# Patient Record
Sex: Male | Born: 1940 | Race: White | Hispanic: No | Marital: Married | Smoking: Former smoker
Health system: Southern US, Community
[De-identification: ages and names within clinical notes are randomized; demographics above are authoritative.]

## PROBLEM LIST (undated history)

## (undated) DIAGNOSIS — I739 Peripheral vascular disease, unspecified: Secondary | ICD-10-CM

## (undated) DIAGNOSIS — I10 Essential (primary) hypertension: Secondary | ICD-10-CM

## (undated) DIAGNOSIS — K2270 Barrett's esophagus without dysplasia: Secondary | ICD-10-CM

## (undated) DIAGNOSIS — K219 Gastro-esophageal reflux disease without esophagitis: Secondary | ICD-10-CM

## (undated) DIAGNOSIS — J449 Chronic obstructive pulmonary disease, unspecified: Secondary | ICD-10-CM

## (undated) DIAGNOSIS — I712 Thoracic aortic aneurysm, without rupture, unspecified: Secondary | ICD-10-CM

## (undated) DIAGNOSIS — I719 Aortic aneurysm of unspecified site, without rupture: Secondary | ICD-10-CM

## (undated) DIAGNOSIS — N433 Hydrocele, unspecified: Secondary | ICD-10-CM

## (undated) DIAGNOSIS — I259 Chronic ischemic heart disease, unspecified: Secondary | ICD-10-CM

## (undated) DIAGNOSIS — E785 Hyperlipidemia, unspecified: Secondary | ICD-10-CM

## (undated) DIAGNOSIS — C801 Malignant (primary) neoplasm, unspecified: Secondary | ICD-10-CM

## (undated) HISTORY — DX: Peripheral vascular disease, unspecified: I73.9

## (undated) HISTORY — DX: Thoracic aortic aneurysm, without rupture: I71.2

## (undated) HISTORY — DX: Aortic aneurysm of unspecified site, without rupture: I71.9

## (undated) HISTORY — DX: Hydrocele, unspecified: N43.3

## (undated) HISTORY — DX: Chronic obstructive pulmonary disease, unspecified: J44.9

## (undated) HISTORY — PX: CORONARY ARTERY BYPASS GRAFT: SHX141

## (undated) HISTORY — DX: Thoracic aortic aneurysm, without rupture, unspecified: I71.20

## (undated) HISTORY — DX: Chronic ischemic heart disease, unspecified: I25.9

## (undated) HISTORY — DX: Barrett's esophagus without dysplasia: K22.70

## (undated) HISTORY — DX: Malignant (primary) neoplasm, unspecified: C80.1

## (undated) HISTORY — DX: Gastro-esophageal reflux disease without esophagitis: K21.9

## (undated) HISTORY — DX: Hyperlipidemia, unspecified: E78.5

## (undated) HISTORY — DX: Essential (primary) hypertension: I10

---

## 1997-10-14 HISTORY — PX: ABDOMINAL AORTIC ANEURYSM REPAIR W/ ENDOLUMINAL GRAFT: SUR7

## 2001-10-14 HISTORY — PX: CORONARY STENT PLACEMENT: SHX1402

## 2008-08-12 ENCOUNTER — Ambulatory Visit (HOSPITAL_BASED_OUTPATIENT_CLINIC_OR_DEPARTMENT_OTHER): Admission: RE | Admit: 2008-08-12 | Discharge: 2008-08-12 | Payer: Self-pay | Admitting: Orthopedic Surgery

## 2008-12-12 DEATH — deceased

## 2009-10-14 DIAGNOSIS — C801 Malignant (primary) neoplasm, unspecified: Secondary | ICD-10-CM

## 2009-10-14 DIAGNOSIS — I712 Thoracic aortic aneurysm, without rupture, unspecified: Secondary | ICD-10-CM

## 2009-10-14 DIAGNOSIS — I739 Peripheral vascular disease, unspecified: Secondary | ICD-10-CM | POA: Insufficient documentation

## 2009-10-14 HISTORY — DX: Thoracic aortic aneurysm, without rupture: I71.2

## 2009-10-14 HISTORY — DX: Thoracic aortic aneurysm, without rupture, unspecified: I71.20

## 2009-10-14 HISTORY — DX: Malignant (primary) neoplasm, unspecified: C80.1

## 2010-01-15 ENCOUNTER — Ambulatory Visit (HOSPITAL_COMMUNITY): Admission: RE | Admit: 2010-01-15 | Discharge: 2010-01-15 | Payer: Self-pay | Admitting: Family Medicine

## 2010-01-26 ENCOUNTER — Ambulatory Visit: Payer: Self-pay | Admitting: Thoracic Surgery

## 2010-02-12 ENCOUNTER — Inpatient Hospital Stay (HOSPITAL_COMMUNITY): Admission: RE | Admit: 2010-02-12 | Discharge: 2010-02-16 | Payer: Self-pay | Admitting: Thoracic Surgery

## 2010-02-12 ENCOUNTER — Encounter: Payer: Self-pay | Admitting: Thoracic Surgery

## 2010-02-12 ENCOUNTER — Ambulatory Visit: Payer: Self-pay | Admitting: Thoracic Surgery

## 2010-02-27 ENCOUNTER — Ambulatory Visit: Payer: Self-pay | Admitting: Thoracic Surgery

## 2010-02-27 ENCOUNTER — Encounter: Admission: RE | Admit: 2010-02-27 | Discharge: 2010-02-27 | Payer: Self-pay | Admitting: Thoracic Surgery

## 2010-03-16 ENCOUNTER — Ambulatory Visit: Payer: Self-pay | Admitting: Thoracic Surgery

## 2010-03-27 ENCOUNTER — Encounter: Admission: RE | Admit: 2010-03-27 | Discharge: 2010-03-27 | Payer: Self-pay | Admitting: Thoracic Surgery

## 2010-03-27 ENCOUNTER — Ambulatory Visit: Payer: Self-pay | Admitting: Thoracic Surgery

## 2010-04-17 ENCOUNTER — Ambulatory Visit: Payer: Self-pay | Admitting: Thoracic Surgery

## 2010-05-22 ENCOUNTER — Ambulatory Visit: Payer: Self-pay | Admitting: Thoracic Surgery

## 2010-05-22 ENCOUNTER — Encounter: Admission: RE | Admit: 2010-05-22 | Discharge: 2010-05-22 | Payer: Self-pay | Admitting: Thoracic Surgery

## 2010-07-24 ENCOUNTER — Ambulatory Visit: Payer: Self-pay | Admitting: Thoracic Surgery

## 2010-07-24 ENCOUNTER — Encounter: Admission: RE | Admit: 2010-07-24 | Discharge: 2010-07-24 | Payer: Self-pay | Admitting: Thoracic Surgery

## 2010-10-14 HISTORY — PX: OTHER SURGICAL HISTORY: SHX169

## 2010-10-24 ENCOUNTER — Encounter
Admission: RE | Admit: 2010-10-24 | Discharge: 2010-10-24 | Payer: Self-pay | Source: Home / Self Care | Attending: Thoracic Surgery | Admitting: Thoracic Surgery

## 2010-10-24 ENCOUNTER — Ambulatory Visit
Admission: RE | Admit: 2010-10-24 | Discharge: 2010-10-24 | Payer: Self-pay | Source: Home / Self Care | Attending: Thoracic Surgery | Admitting: Thoracic Surgery

## 2011-01-01 LAB — BASIC METABOLIC PANEL
BUN: 14 mg/dL (ref 6–23)
BUN: 9 mg/dL (ref 6–23)
CO2: 26 mEq/L (ref 19–32)
Calcium: 8 mg/dL — ABNORMAL LOW (ref 8.4–10.5)
Calcium: 8.6 mg/dL (ref 8.4–10.5)
Creatinine, Ser: 0.75 mg/dL (ref 0.4–1.5)
GFR calc non Af Amer: >60 mL/min (ref >60)
Glucose, Bld: 154 mg/dL — ABNORMAL HIGH (ref 70–99)
Potassium: 3.7 mEq/L (ref 3.5–5.1)

## 2011-01-01 LAB — CBC
HCT: 34 % — ABNORMAL LOW (ref 39.0–52.0)
HCT: 34.3 % — ABNORMAL LOW (ref 39.0–52.0)
HCT: 38.8 % — ABNORMAL LOW (ref 39.0–52.0)
Hemoglobin: 11.7 g/dL — ABNORMAL LOW (ref 13.0–17.0)
Hemoglobin: 11.8 g/dL — ABNORMAL LOW (ref 13.0–17.0)
MCHC: 34 g/dL (ref 30.0–36.0)
MCHC: 34.7 g/dL (ref 30.0–36.0)
MCHC: 35.2 g/dL (ref 30.0–36.0)
MCV: 92.8 fL (ref 78.0–100.0)
MCV: 93.1 fL (ref 78.0–100.0)
MCV: 92.7 fL (ref 78.0–100.0)
Platelets: 155 10*3/uL (ref 150–400)
Platelets: 188 10*3/uL (ref 150–400)
RBC: 3.66 MIL/uL — ABNORMAL LOW (ref 4.22–5.81)
RBC: 3.69 MIL/uL — ABNORMAL LOW (ref 4.22–5.81)
RDW: 13.4 % (ref 11.5–15.5)
RDW: 13.9 % (ref 11.5–15.5)
RDW: 13.8 % (ref 11.5–15.5)
WBC: 9.5 10*3/uL (ref 4.0–10.5)
WBC: 5.5 10*3/uL (ref 4.0–10.5)

## 2011-01-01 LAB — COMPREHENSIVE METABOLIC PANEL
ALT: 25 U/L (ref 0–53)
AST: 30 U/L (ref 0–37)
Albumin: 4.4 g/dL (ref 3.5–5.2)
BUN: 14 mg/dL (ref 6–23)
CO2: 29 mEq/L (ref 19–32)
Chloride: 104 mEq/L (ref 96–112)
Chloride: 106 mEq/L (ref 96–112)
Creatinine, Ser: 0.96 mg/dL (ref 0.4–1.5)
Creatinine, Ser: 0.84 mg/dL (ref 0.4–1.5)
GFR calc Af Amer: >60 mL/min (ref >60)
GFR calc non Af Amer: >60 mL/min (ref >60)
Glucose, Bld: 125 mg/dL — ABNORMAL HIGH (ref 70–99)
Glucose, Bld: 91 mg/dL (ref 70–99)
Total Bilirubin: 1 mg/dL (ref 0.3–1.2)
Total Bilirubin: 0.9 mg/dL (ref 0.3–1.2)

## 2011-01-01 LAB — BLOOD GAS, ARTERIAL
Acid-base deficit: 0.7 mmol/L (ref 0.0–2.0)
Bicarbonate: 23.4 mEq/L (ref 20.0–24.0)
O2 Saturation: 96.8 %
Patient temperature: 98.6
TCO2: 24.5 mmol/L (ref 0–100)
pH, Arterial: 7.407 (ref 7.350–7.450)

## 2011-01-01 LAB — TYPE AND SCREEN

## 2011-01-01 LAB — URINALYSIS, ROUTINE W REFLEX MICROSCOPIC
Bilirubin Urine: NEGATIVE
Ketones, ur: NEGATIVE mg/dL
Nitrite: NEGATIVE
Protein, ur: NEGATIVE mg/dL
Specific Gravity, Urine: 1.016 (ref 1.005–1.030)
Urobilinogen, UA: 0.2 mg/dL (ref 0.0–1.0)

## 2011-01-01 LAB — PROTIME-INR
INR: 1.04 (ref 0.00–1.49)
Prothrombin Time: 13.5 seconds (ref 11.6–15.2)

## 2011-01-01 LAB — APTT: aPTT: 31 seconds (ref 24–37)

## 2011-01-01 LAB — POCT I-STAT 3, ART BLOOD GAS (G3+)
O2 Saturation: 95 %
pCO2 arterial: 39.5 mmHg (ref 35.0–45.0)
pH, Arterial: 7.41 (ref 7.350–7.450)

## 2011-02-19 ENCOUNTER — Ambulatory Visit: Payer: Self-pay | Admitting: Thoracic Surgery

## 2011-02-25 ENCOUNTER — Other Ambulatory Visit: Payer: Self-pay | Admitting: Thoracic Surgery

## 2011-02-25 DIAGNOSIS — M5124 Other intervertebral disc displacement, thoracic region: Secondary | ICD-10-CM

## 2011-02-26 ENCOUNTER — Ambulatory Visit
Admission: RE | Admit: 2011-02-26 | Discharge: 2011-02-26 | Disposition: A | Discharge status: Home / Self Care | Payer: Medicare Other | Source: Ambulatory Visit | Attending: Thoracic Surgery | Admitting: Thoracic Surgery

## 2011-02-26 ENCOUNTER — Ambulatory Visit (INDEPENDENT_AMBULATORY_CARE_PROVIDER_SITE_OTHER): Payer: Medicare Other | Admitting: Thoracic Surgery

## 2011-02-26 DIAGNOSIS — M5124 Other intervertebral disc displacement, thoracic region: Secondary | ICD-10-CM

## 2011-02-26 DIAGNOSIS — C3490 Malignant neoplasm of unspecified part of unspecified bronchus or lung: Secondary | ICD-10-CM

## 2011-02-26 NOTE — Letter (Signed)
March 16, 2010     Re:  Drew [name], FILES                   DOB:  12-May-1941     The patient came back for follow up today.  He has one chest tube site.  This has taken a while to heal, but overall it looks good.  I cauterized  some granulation tissue with silver nitrate.  His blood pressure is  125/76, pulse 64, respirations 18, sats were 97%.  Lungs are clear to  auscultation and percussion.  I will see him back again March 27, 2010,  with a chest x-ray.   Ines Bloomer, M.D.  Electronically Signed   DPB/MEDQ  D:  03/16/2010  T:  03/17/2010  Job:  [id number]

## 2011-02-26 NOTE — Assessment & Plan Note (Signed)
OFFICE VISIT   MARTON, MALIZIA A  DOB:  November 14, 1940                                        March 16, 2010  CHART #:  [id number]   The patient came back for follow up today.  He has one chest tube site.  This has taken a while to heal, but overall it looks good.  I cauterized  some granulation tissue with silver nitrate.  His blood pressure is  125/76, pulse 64, respirations 18, sats were 97%.  Lungs are clear to  auscultation and percussion.  I will see him back again March 27, 2010,  with a chest x-ray.   Ines Bloomer, M.D.  Electronically Signed   DPB/MEDQ  D:  03/16/2010  T:  03/27/2010  Job:  [id number]

## 2011-02-26 NOTE — Assessment & Plan Note (Signed)
OFFICE VISIT   Drew [name], Drew [name]  DOB:  [birth year]                                        July 24, 2010  CHART #:  [id number]   The patient came today.  He is doing well and having some mild chest  pain, but overall making good progress.  We plan to see him back again  in 3 months with [name] chest x-ray.  He will get [name] CT scan of the chest next  month.  His blood pressure was 117/74, pulse 64, respirations 18, sats  were 94%.  Incisions are well healed.   Ines Bloomer, M.D.  Electronically Signed   DPB/MEDQ  D:  07/24/2010  T:  07/25/2010  Job:  [id number]

## 2011-02-26 NOTE — Assessment & Plan Note (Signed)
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  He will continue Neurontin.  His  incision is well healed.  His blood pressure was 114/68, pulse 48,  respirations 18, and sats were 98%.  We will see him again in 4 weeks  with [name] chest x-ray.  If he has more pain, he will call back and we will  prescribe Lidoderm patch.  The other possibilities will be Cymbalta and  Lyrica.   Ines Bloomer, M.D.  Electronically Signed   DPB/MEDQ  D:  04/17/2010  T:  04/17/2010  Job:  [id number]

## 2011-02-26 NOTE — Letter (Signed)
March 27, 2010   Gwendlyn Deutscher, MD  [address]  [place], [place] [address]   Re:  Drew [name], Drew [name]                 DOB:  02-Apr-1941   Dear Dr. Jeanie Sewer:   The patient came back for followup today.  His posterior chest tube site  is healed.  His blood pressure was 130/72, pulse 96, respirations 18,  and sats were 98%.  He is doing well overall.  I will see him back again  in 4 weeks for another check.  I gave him a prescription for Neurontin  because of some dysesthesias.   Ines Bloomer, M.D.  Electronically Signed   DPB/MEDQ  D:  03/27/2010  T:  03/27/2010  Job:  [id number]

## 2011-02-26 NOTE — Letter (Signed)
May 22, 2010   Gwendlyn Deutscher, MD  [address]  [place], [place] [address]   Re:  JIOVANNY, BURDELL                 DOB:  1941/09/08   Dear Dr. Jeanie Sewer,   I saw the patient back today, still having some mild post thoracotomy  pain.  We switched him from Neurontin to Lyrica 75 mg a day and see if  this will help.  The pain has been improving.  His chest x-ray is  stable.  Lungs are clear to auscultation and percussion.  He was a stage  IA on his lung cancer, so I am referred him to Dr. Gery Pray for  evaluation, but I do not think she will recommend other than just  continued followup.  I will see him back in 2 months with chest x-ray.  His blood pressure was 134/79, pulse 56, respirations 18, sats were 96.   Ines Bloomer, M.D.  Electronically Signed   DPB/MEDQ  D:  05/22/2010  T:  05/23/2010  Job:  [id number]

## 2011-02-26 NOTE — Op Note (Signed)
NAME:  Drew [name], FIALLOS           ACCOUNT NO.:  [id number]   MEDICAL RECORD NO.:  [id number]          PATIENT TYPE:  AMB   LOCATION:  DSC                          FACILITY:  [facility]   PHYSICIAN:  Cindee Salt, M.D.       DATE OF BIRTH:  1940-11-18   DATE OF PROCEDURE:  08/12/2008  DATE OF DISCHARGE:                               OPERATIVE REPORT   PREOPERATIVE DIAGNOSIS:  Infection metacarpophalangeal joint, left index  finger.   POSTOPERATIVE DIAGNOSIS:  Infection metacarpophalangeal joint, left  index finger.   OPERATION:  Incision and drainage metacarpophalangeal joint, left index  finger.   SURGEON:  Cindee Salt, MD   ASSISTANT:  None.   ANESTHESIA:  IV regional.   ANESTHESIOLOGIST:  Dr. Jean Rosenthal.   HISTORY:  The patient is a 70 year old male who has a history of  puncture wound over the metacarpophalangeal joint of his left index  finger 10 days ago.  He has been on antibiotics but has had continued  pain and swelling.  He has not had fevers or chills but does have pain  in the upper arm with epitrochlear nodes.  X-rays reveal no significant  bony change.  Diagnosis is pyarthrosis, left index finger  metacarpophalangeal joint.  Plan is incision and drainage.  He is aware  of risks and complications including infection, generation of the joint,  the fact that this will be left open.  He is desirous of proceeding to  have this done.  In the preoperative area, the patient is seen, the  extremity marked by both the patient and surgeon.   PROCEDURE:  The patient was brought to the operating room where a  forearm-based IV regional anesthetic was carried out without difficulty.  He was prepped using DuraPrep in supine position, left arm free.  A time-  out was taken.  After adequate anesthesia was afforded, a curvilinear  incision was made over the metacarpophalangeal joint radial side of the  index finger were the puncture wound was, carried down through the  subcutaneous  tissue.  The significant swelling was present.  This was  carried down making a small incision in the sagittal fibers.  The joint  was then opened.  Cloudy fluid was immediately apparent along with  granulation tissue.  Cultures were took, both aerobic, anaerobic, fungal  cultures.  The joint was then minimally debrided and thoroughly  irrigated with saline.  A vessel loop drain was placed in the depths of  the joint.  The skin was loosely closed distally with interrupted 5-0  Vicryl Rapide sutures.  A sterile compressive dressing and splint was  applied.  The patient tolerated the procedure well, was taken to the  recovery room for observation in satisfactory condition.  He will be  discharged home, to return to the [facility] [place] in 1 week on  Vicodin.  He has been on Septra DS.          ______________________________  Cindee Salt, M.D.    GK/MEDQ  D:  08/12/2008  T:  08/13/2008  Job:  [id number]

## 2011-02-26 NOTE — Assessment & Plan Note (Signed)
OFFICE VISIT   Drew [name], Drew [name] A  DOB:  07/18/1941                                        October 24, 2010  CHART #:  [id number]   HISTORY:  The patient comes in today for a 42-month followup.  He is  status post a left lingulectomy in May 2011.  He has done well since his  last office visit.  The pain which he had been having around the  incision site and his left anterior chest has almost completely resolved  at this point.  He has some occasional shortness of breath with  increased activity, but overall his breathing has been stable.  He  denies any cough, weight loss, or hemoptysis.  He is being followed by  Dr. Gilman Buttner and reportedly had a CT scan in December.  We do not have  report of this, but we do have his previous CT report from October,  which was stable.  Overall, he is doing well.   PHYSICAL EXAMINATION:  Blood pressure 114/72, pulse 64, respirations 16,  and O2 sat 94% on room air.  Left VATS incisions have healed well.  Chest is clear.   Chest x-ray shows postoperative changes, but otherwise no acute  findings.   ASSESSMENT/PLAN:  Dr. Edwyna Shell saw the patient today and reviewed his  films.  We will plan on seeing him back one more time in 4 months with a  repeat chest x-ray.  If he continues to progress well at that time, we  will probably discharge him from our care.   Coral Ceo, P.A.   GC/MEDQ  D:  10/24/2010  T:  10/24/2010  Job:  [id number]   cc:   Dellia Beckwith, M.D.

## 2011-02-26 NOTE — Assessment & Plan Note (Signed)
OFFICE VISIT   LINK, BURGESON  DOB:  [birth year]                                        Feb 27, 2010  CHART #:  [id number]   REASON FOR OFFICE VISIT:  Routine followup status post left VATS, left  lingulectomy with lymph node dissection.   HISTORY OF PRESENT ILLNESS:  This is a 70 year old Caucasian male who  was found have a left upper lobe lesion that was positive on PET scan.  He underwent left VATS, left lingulectomy with lymph node dissection by  Dr. Edwyna Shell on Feb 12, 2010.  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  The patient's only complaint since  having been discharged from the hospital is some incisional pain.  He  denies any fever, increasing shortness of breath, or chest pain.   PHYSICAL EXAMINATION:  General:  This is a pleasant 70 year old  Caucasian male accompanied by his wife and daughter, who is in no acute  distress, who is alert, oriented, and cooperative.  Vital Signs:  Latest  vital signs are as follows.  BP 131/79, heart rate 62, respirations 18,  and O2 sat 94% on room air.  Cardiovascular:  Regular rate and rhythm.  Pulmonary:  Slightly decreased at the left base, right lung is clear.  No rales, wheezes, or rhonchi.  Abdomen:  Soft and nontender.  Bowel  sounds present.  Wounds:  Right posterior wound is well healed, clean,  and dry.  Chest:  Anterior chest tube site fairly well healed.  No signs  of infection.  Posterior chest tube site has skin necrosis on the edge.  This was debrided.  All sutures were removed.  The posterior chest tube  site is slightly opened.  No drainage, however.   DIAGNOSTIC TESTS:  Chest x-ray done today shows improvement in aeration  of the left lung.  No pneumothorax.  Probable small left pleural  effusion.  Right lung is clear.   IMPRESSION AND PLAN:  1. 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  2. The patient was instructed he may begin driving short distances at      the end of the week provided he is not taking any narcotics for      pain.  3. The patient was to continue with lifting precautions (no more than      10 pounds for at least 1-2 more weeks).  4. At the patient's request, he was given another prescription for      Percocet as the hydrocodone that was previously called in, did not      alleviate his pain symptoms.  Lastly, the patient is going to      follow up with Dr. Edwyna Shell with a chest x-ray.  Office will contact      him with an appointment date and time.   Ines Bloomer, M.D.  Electronically Signed   DZ/MEDQ  D:  02/27/2010  T:  02/27/2010  Job:  [id number]   cc:   Durenda Hurt, M.D.

## 2011-02-26 NOTE — Letter (Signed)
January 26, 2010   Gwendlyn Deutscher, MD  [address]  [place], [place] [address]   Re:  Drew [name], Drew [name]                   DOB:  08/30/1941   Dear Dr. Jeanie Sewer:   I appreciate the opportunity of seeing the patient.  This patient has  had multiple tobacco related disease.  He quit smoking in 1991.  He is  now found to have a left upper lobe lesion that was positive on PET scan  which would go along with an early non-small cell lung cancer.  His  other problems have to deal with he has had aneurysmal disease.  He has  had infrarenal and bilateral iliac stents.  He has had left renal stent.  He has also had coronary artery stents, last one being 4 years ago.  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  He has had  no hemoptysis, fevers, chills, or excessive sputum.  His pulmonary  function test are pending.   MEDICATIONS:  1. Trazodone 100 mg a day.  2. Theophylline 300 mg daily.  3. Plavix 75 mg daily.  4. Singulair 10 mg daily.  5. Lisinopril 10 mg daily.  6. Metoprolol 200 mg daily.  7. Omeprazole 40 mg twice a day.  8. Simvastatin 20 mg daily.  9. Flexeril p.r.n.  10.Bentyl 10 mg t.i.d.  11.Zyrtec 10 mg daily.  12.Aspirin 325 mg daily.   ALLERGIES:  He is allergic to Keflex that causes itching and neomycin.   PAST MEDICAL HISTORY:  He has hypertension, hypercholesterolemia.  He  had previous myocardial infarction in 2002 and 2004.   FAMILY HISTORY:  Positive for cardiac disease.   SOCIAL HISTORY:  He is married.  He has 1 child.  Quit smoking in 1991.  Does not drink alcohol on a regular basis.   REVIEW OF SYSTEMS:  GENERAL:  He is on 5 feet 7 inches, 177 pounds.  CARDIAC:  See history of present illness.  No recent angina.  Stress  test done 3 months ago was negative.  PULMONARY:  He has been treated with some asthma.  No hemoptysis.  GI:  He has got reflux with some abdominal pain and occasional diarrhea.  GU:  He has got frequent urination.  VASCULAR:  He has had a previous TIA.  No claudication.  No DVT.  NEUROLOGIC:  No dizziness, headaches, blackouts, seizures.  MUSCULOSKELETAL:  Arthritis.  PSYCHIATRIC:  No depression or nervousness.  ENT:  No changes in eyesight or hearing.  HEMATOLOGIC:  No problems with bleeding, clotting disorders, or anemia.   PHYSICAL EXAMINATION:  General:  He is a well-developed Caucasian male  in no acute distress.  Vital signs:  His blood pressure is 136/70, pulse  58, respirations 18, sats were 96%.  Head, Eyes, Ears, Nose, and Throat:  Unremarkable.  Neck:  Supple without thyromegaly.  There is no  supraclavicular or axillary adenopathy.  Chest:  Clear to auscultation.  Heart:  Regular sinus rhythm.  No murmurs.  Abdomen:  Soft.  There is no  hepatosplenomegaly.  Extremities:  Pulses are 2+.  There is no clubbing  or edema.  Neurologic:  He is oriented x3.  Sensory and motor intact.  Cranial nerves intact.   ASSESSMENT AND PLAN:  I recommended that he have a left video-assisted  thoracoscopic surgery and a left upper lobectomy assuming his pulmonary  function tests are satisfactory.  I have explained the risks of the  procedure.  We will need to stop his Plavix 5 days prior to surgery.  I  appreciate the opportunity of seeing the patient.   Sincerely,   Ines Bloomer, M.D.  Electronically Signed   DPB/MEDQ  D:  01/26/2010  T:  01/27/2010  Job:  [id number]   cc:   Aundra Dubin. Revankar, M.D.

## 2011-02-27 NOTE — Letter (Signed)
Feb 26, 2011  Dellia Beckwith, MD [phone] S[address] [place], [place] [address]  Re:  Drew [name], Drew [name]                 DOB:  1940-11-26  Dear Gilman Buttner;  I saw the patient back today and reviewed his chest x-ray and CT scan which showed no evidence of recurrence of his cancer over 1 year since his surgery.  She told me that she will be continued to follow him regarding his cancer.  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  He says this is being followed by Dr. Tomie China.  I just told him that if it gets worse that he had to have him see one of my partners regarding repair of this 5-5.5 is really where we started considering repair of an ascending aneurysm.  He apparently also has some renal artery stenosis, and will be seeing a vascular surgeon in [facility].  His blood pressure is 123/81, pulse 60, respirations 16 and sats were 96%.  I will be happy to see him again at any time, but I will let you follow him regarding his cancer.  Ines Bloomer, M.D. Electronically Signed  DPB/MEDQ  D:  02/26/2011  T:  02/27/2011  Job:  [id number]  cc:   Gwendlyn Deutscher II, M.D. Aundra Dubin. Revankar, M.D.

## 2011-03-23 IMAGING — CR DG CHEST 1V PORT
1 series · 1 of 1 positions shown · non-contrast
Comparison: 02/15/2010

CLINICAL DATA: Chest tube removal.

PORTABLE CHEST - 1 VIEW

[AP]
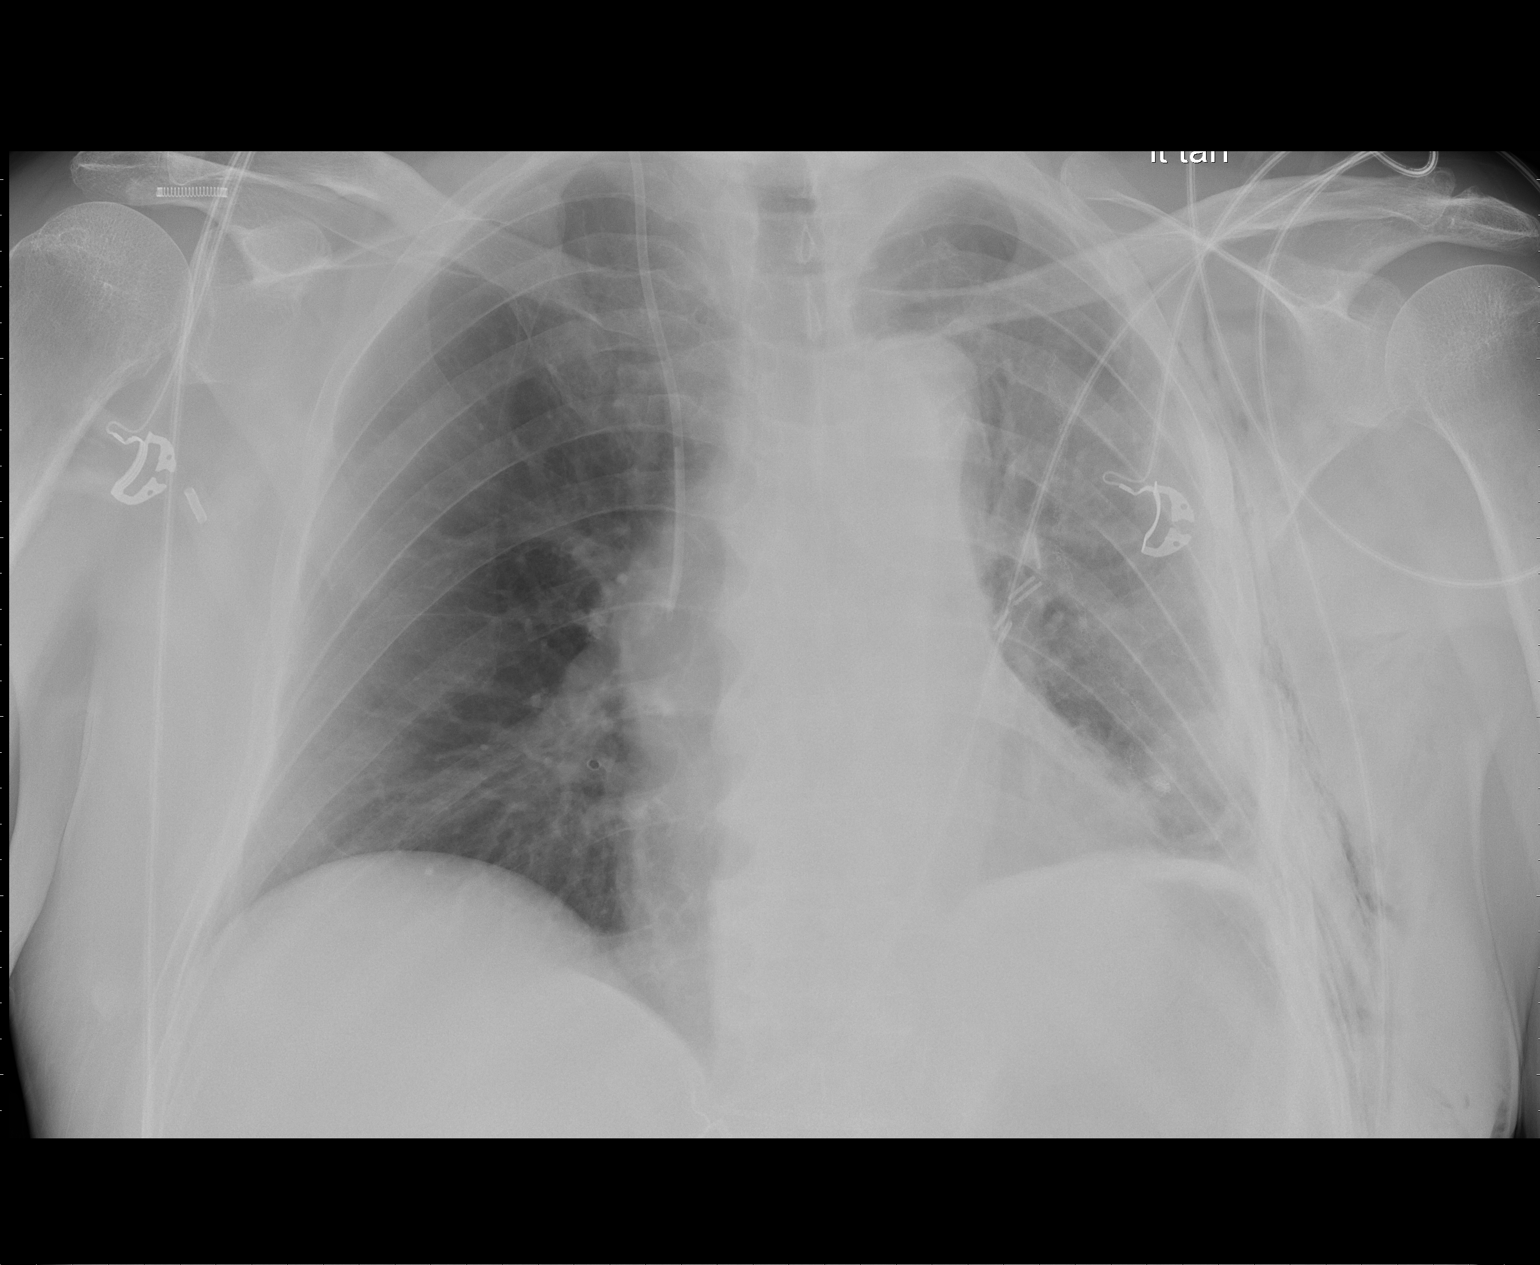

[1 of 1 positions shown; findings below may reference images not displayed]

FINDINGS: Left chest tube has been removed.  Question small left
apical pneumothorax.

Improved aeration in the left lower lobe.  There is a small left
pleural effusion.  The right lung is clear.  Central line tip is in
the SVC.
IMPRESSION: Left chest tube removal.  Possible small left pneumothorax.

Improvement in left lower lobe atelectasis.

## 2011-03-24 IMAGING — CR DG CHEST 2V
2 series · 2 of 2 positions shown · non-contrast
Comparison: the previous day's study

CLINICAL DATA: Cough

CHEST - 2 VIEW

[w chest pa]
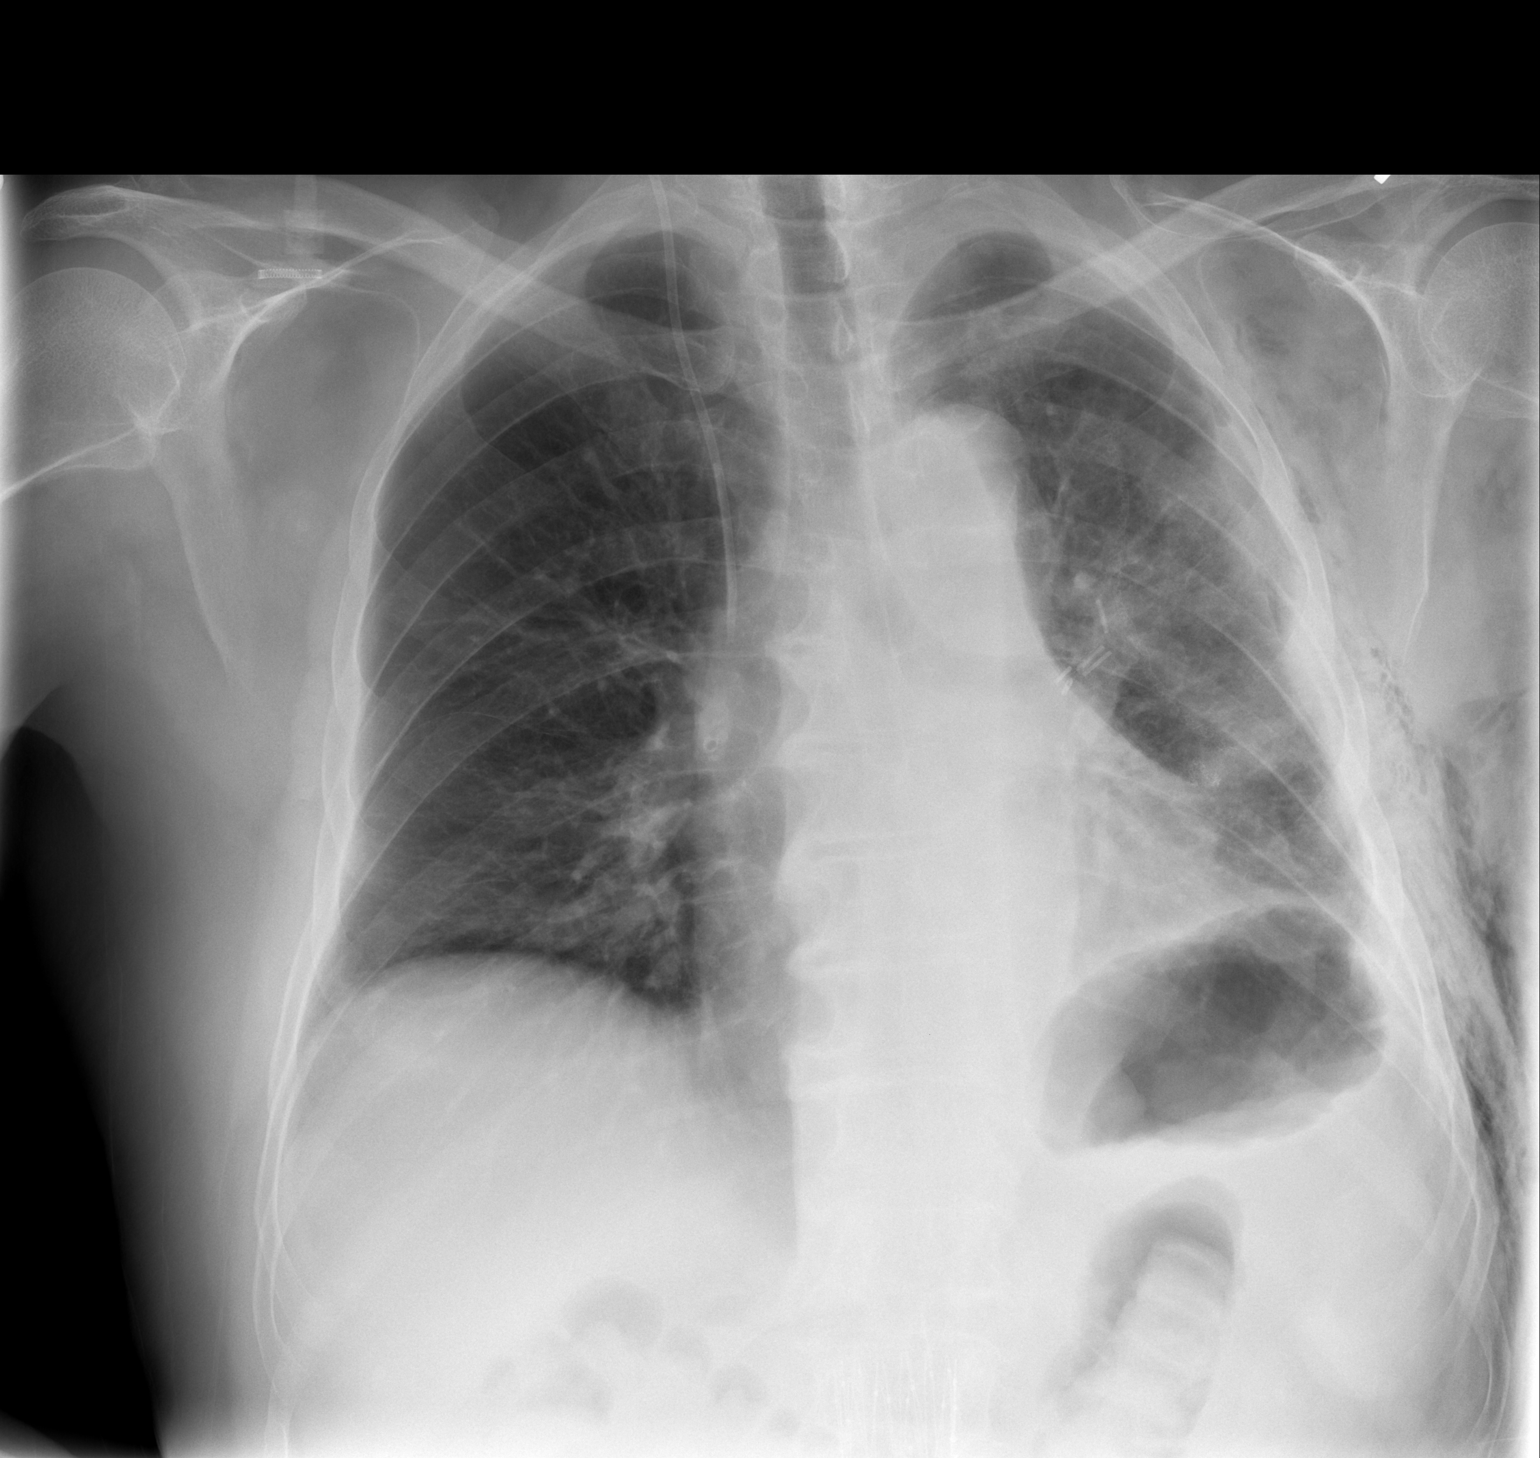

[w chest lat]
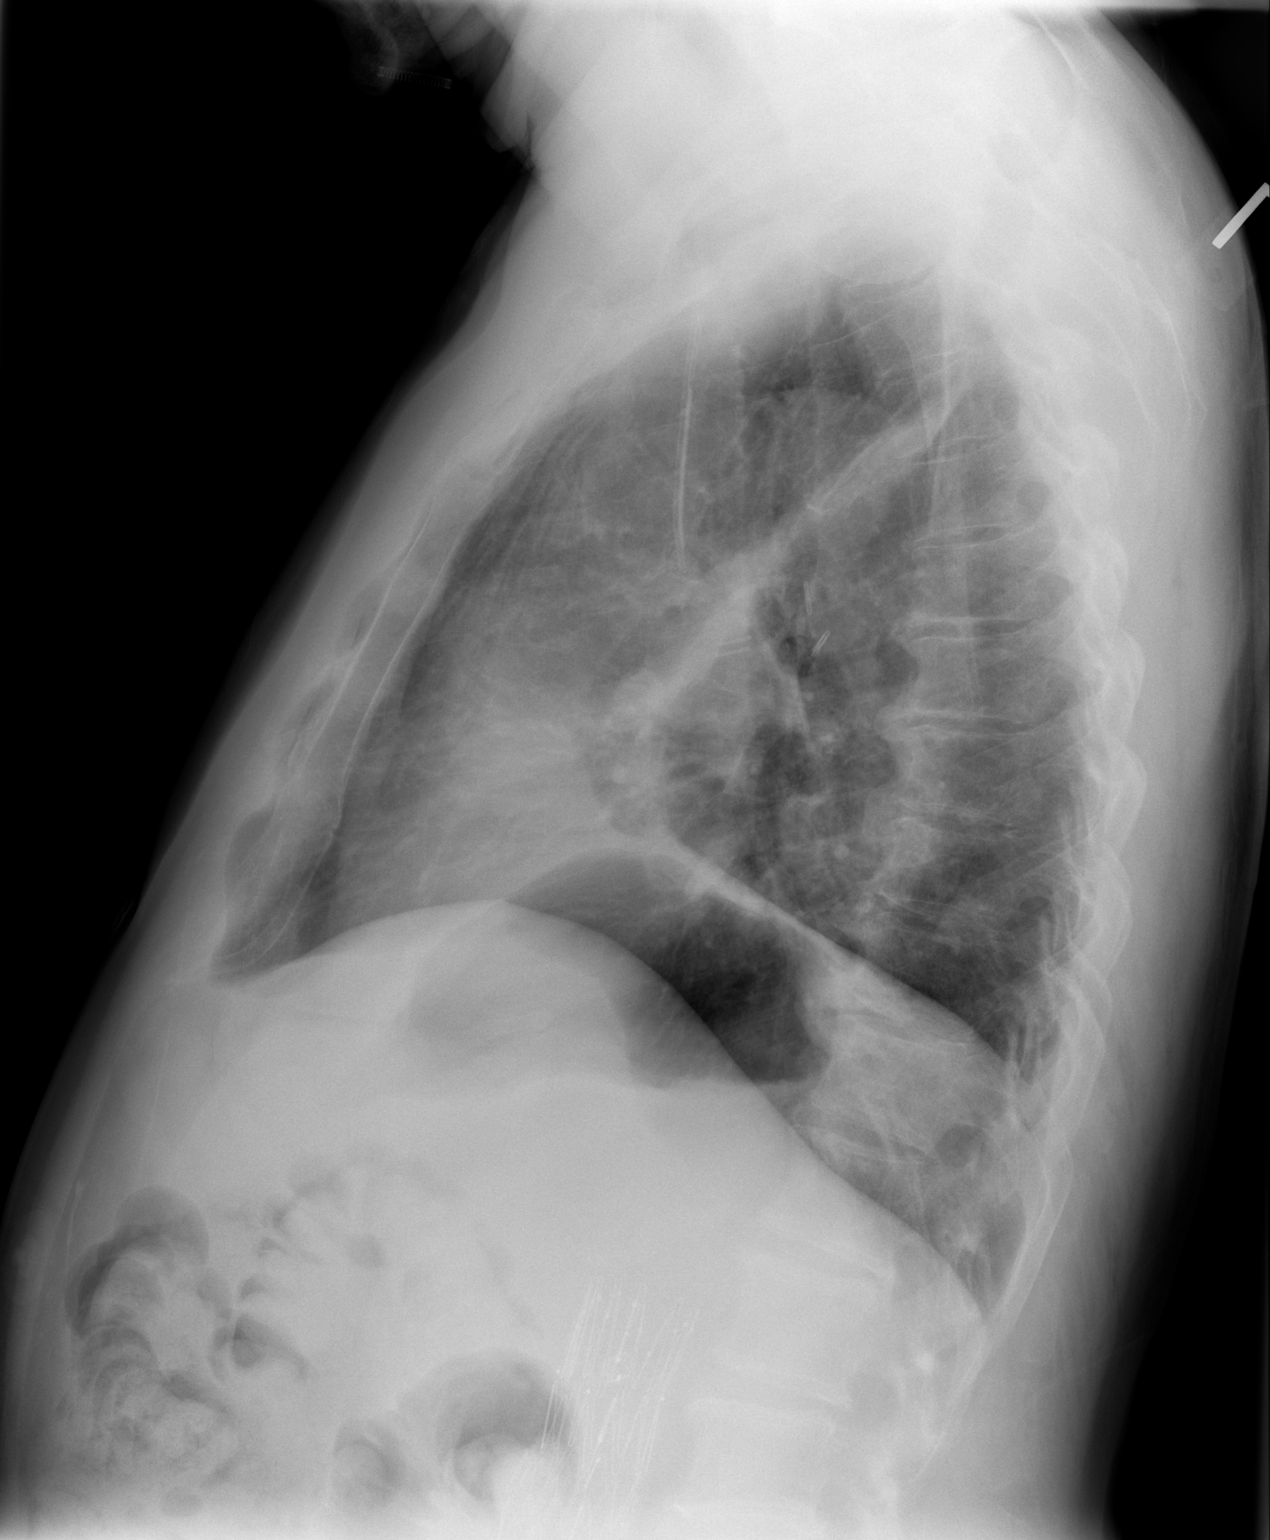

[2 of 2 positions shown; findings below may reference images not displayed]

FINDINGS: Vascular clips and staple line at the left hilum as
before.  There is a tiny loculated left lateral hydropneumothorax.
Left lateral subcutaneous emphysema is stable.  Patchy airspace
opacities in the mid and lower left lung are stable.

Right lung clear.  Right IJ central line stable.  An aortic stent
graft is partially seen. Cervical plate partially seen.
IMPRESSION: 1.  Small left lateral hydropneumothorax.

## 2011-07-16 LAB — BODY FLUID CULTURE
Culture: NO GROWTH
Gram Stain: NONE SEEN

## 2011-07-16 LAB — POCT I-STAT, CHEM 8
Chloride: 103
Creatinine, Ser: 1.4
Glucose, Bld: 103 — ABNORMAL HIGH
Potassium: 5.3 — ABNORMAL HIGH
Sodium: 136

## 2011-07-16 LAB — ANAEROBIC CULTURE: Gram Stain: NONE SEEN

## 2011-10-17 DIAGNOSIS — R300 Dysuria: Secondary | ICD-10-CM | POA: Diagnosis not present

## 2011-10-28 DIAGNOSIS — J029 Acute pharyngitis, unspecified: Secondary | ICD-10-CM | POA: Diagnosis not present

## 2011-10-29 DIAGNOSIS — C3490 Malignant neoplasm of unspecified part of unspecified bronchus or lung: Secondary | ICD-10-CM | POA: Diagnosis not present

## 2011-10-29 DIAGNOSIS — I712 Thoracic aortic aneurysm, without rupture: Secondary | ICD-10-CM | POA: Diagnosis not present

## 2011-11-04 DIAGNOSIS — C342 Malignant neoplasm of middle lobe, bronchus or lung: Secondary | ICD-10-CM | POA: Diagnosis not present

## 2012-01-01 DIAGNOSIS — K5732 Diverticulitis of large intestine without perforation or abscess without bleeding: Secondary | ICD-10-CM | POA: Diagnosis not present

## 2012-01-06 DIAGNOSIS — M629 Disorder of muscle, unspecified: Secondary | ICD-10-CM | POA: Diagnosis not present

## 2012-01-06 DIAGNOSIS — K224 Dyskinesia of esophagus: Secondary | ICD-10-CM | POA: Diagnosis not present

## 2012-01-06 DIAGNOSIS — R1310 Dysphagia, unspecified: Secondary | ICD-10-CM | POA: Diagnosis not present

## 2012-01-08 DIAGNOSIS — I701 Atherosclerosis of renal artery: Secondary | ICD-10-CM | POA: Diagnosis not present

## 2012-01-08 DIAGNOSIS — I2510 Atherosclerotic heart disease of native coronary artery without angina pectoris: Secondary | ICD-10-CM | POA: Diagnosis not present

## 2012-01-08 DIAGNOSIS — Z01810 Encounter for preprocedural cardiovascular examination: Secondary | ICD-10-CM | POA: Diagnosis not present

## 2012-01-08 DIAGNOSIS — I739 Peripheral vascular disease, unspecified: Secondary | ICD-10-CM | POA: Diagnosis not present

## 2012-01-09 DIAGNOSIS — Z01810 Encounter for preprocedural cardiovascular examination: Secondary | ICD-10-CM | POA: Diagnosis not present

## 2012-01-09 DIAGNOSIS — I2510 Atherosclerotic heart disease of native coronary artery without angina pectoris: Secondary | ICD-10-CM | POA: Diagnosis not present

## 2012-01-09 DIAGNOSIS — I10 Essential (primary) hypertension: Secondary | ICD-10-CM | POA: Diagnosis not present

## 2012-01-16 DIAGNOSIS — K2960 Other gastritis without bleeding: Secondary | ICD-10-CM | POA: Diagnosis not present

## 2012-01-16 DIAGNOSIS — K2270 Barrett's esophagus without dysplasia: Secondary | ICD-10-CM | POA: Diagnosis not present

## 2012-01-16 DIAGNOSIS — Z7902 Long term (current) use of antithrombotics/antiplatelets: Secondary | ICD-10-CM | POA: Diagnosis not present

## 2012-01-16 DIAGNOSIS — I10 Essential (primary) hypertension: Secondary | ICD-10-CM | POA: Diagnosis not present

## 2012-01-16 DIAGNOSIS — Z7982 Long term (current) use of aspirin: Secondary | ICD-10-CM | POA: Diagnosis not present

## 2012-01-16 DIAGNOSIS — Z87891 Personal history of nicotine dependence: Secondary | ICD-10-CM | POA: Diagnosis not present

## 2012-01-16 DIAGNOSIS — K219 Gastro-esophageal reflux disease without esophagitis: Secondary | ICD-10-CM | POA: Diagnosis not present

## 2012-01-16 DIAGNOSIS — Z8673 Personal history of transient ischemic attack (TIA), and cerebral infarction without residual deficits: Secondary | ICD-10-CM | POA: Diagnosis not present

## 2012-01-16 DIAGNOSIS — M129 Arthropathy, unspecified: Secondary | ICD-10-CM | POA: Diagnosis not present

## 2012-01-16 DIAGNOSIS — I714 Abdominal aortic aneurysm, without rupture: Secondary | ICD-10-CM | POA: Diagnosis not present

## 2012-01-16 DIAGNOSIS — N4289 Other specified disorders of prostate: Secondary | ICD-10-CM | POA: Diagnosis not present

## 2012-01-16 DIAGNOSIS — Z79899 Other long term (current) drug therapy: Secondary | ICD-10-CM | POA: Diagnosis not present

## 2012-01-16 DIAGNOSIS — I2510 Atherosclerotic heart disease of native coronary artery without angina pectoris: Secondary | ICD-10-CM | POA: Diagnosis not present

## 2012-01-16 DIAGNOSIS — E7800 Pure hypercholesterolemia, unspecified: Secondary | ICD-10-CM | POA: Diagnosis not present

## 2012-01-16 DIAGNOSIS — I739 Peripheral vascular disease, unspecified: Secondary | ICD-10-CM | POA: Diagnosis not present

## 2012-01-16 DIAGNOSIS — J449 Chronic obstructive pulmonary disease, unspecified: Secondary | ICD-10-CM | POA: Diagnosis not present

## 2012-01-16 DIAGNOSIS — Z85118 Personal history of other malignant neoplasm of bronchus and lung: Secondary | ICD-10-CM | POA: Diagnosis not present

## 2012-04-06 DIAGNOSIS — Z09 Encounter for follow-up examination after completed treatment for conditions other than malignant neoplasm: Secondary | ICD-10-CM | POA: Diagnosis not present

## 2012-04-06 DIAGNOSIS — Z85118 Personal history of other malignant neoplasm of bronchus and lung: Secondary | ICD-10-CM | POA: Diagnosis not present

## 2012-05-18 DIAGNOSIS — M545 Low back pain: Secondary | ICD-10-CM | POA: Diagnosis not present

## 2012-05-28 DIAGNOSIS — N401 Enlarged prostate with lower urinary tract symptoms: Secondary | ICD-10-CM | POA: Diagnosis not present

## 2012-06-04 DIAGNOSIS — R351 Nocturia: Secondary | ICD-10-CM | POA: Diagnosis not present

## 2012-06-04 DIAGNOSIS — N402 Nodular prostate without lower urinary tract symptoms: Secondary | ICD-10-CM | POA: Diagnosis not present

## 2012-06-04 DIAGNOSIS — N401 Enlarged prostate with lower urinary tract symptoms: Secondary | ICD-10-CM | POA: Diagnosis not present

## 2012-07-01 DIAGNOSIS — M999 Biomechanical lesion, unspecified: Secondary | ICD-10-CM | POA: Diagnosis not present

## 2012-07-01 DIAGNOSIS — IMO0002 Reserved for concepts with insufficient information to code with codable children: Secondary | ICD-10-CM | POA: Diagnosis not present

## 2012-07-01 DIAGNOSIS — M5137 Other intervertebral disc degeneration, lumbosacral region: Secondary | ICD-10-CM | POA: Diagnosis not present

## 2012-07-01 DIAGNOSIS — M545 Low back pain: Secondary | ICD-10-CM | POA: Diagnosis not present

## 2012-07-02 DIAGNOSIS — M545 Low back pain: Secondary | ICD-10-CM | POA: Diagnosis not present

## 2012-07-02 DIAGNOSIS — Z23 Encounter for immunization: Secondary | ICD-10-CM | POA: Diagnosis not present

## 2012-07-02 DIAGNOSIS — M999 Biomechanical lesion, unspecified: Secondary | ICD-10-CM | POA: Diagnosis not present

## 2012-07-02 DIAGNOSIS — M5137 Other intervertebral disc degeneration, lumbosacral region: Secondary | ICD-10-CM | POA: Diagnosis not present

## 2012-07-02 DIAGNOSIS — IMO0002 Reserved for concepts with insufficient information to code with codable children: Secondary | ICD-10-CM | POA: Diagnosis not present

## 2012-07-06 DIAGNOSIS — IMO0002 Reserved for concepts with insufficient information to code with codable children: Secondary | ICD-10-CM | POA: Diagnosis not present

## 2012-07-06 DIAGNOSIS — M5137 Other intervertebral disc degeneration, lumbosacral region: Secondary | ICD-10-CM | POA: Diagnosis not present

## 2012-07-06 DIAGNOSIS — M545 Low back pain: Secondary | ICD-10-CM | POA: Diagnosis not present

## 2012-07-06 DIAGNOSIS — M999 Biomechanical lesion, unspecified: Secondary | ICD-10-CM | POA: Diagnosis not present

## 2012-07-08 DIAGNOSIS — M545 Low back pain: Secondary | ICD-10-CM | POA: Diagnosis not present

## 2012-07-08 DIAGNOSIS — IMO0002 Reserved for concepts with insufficient information to code with codable children: Secondary | ICD-10-CM | POA: Diagnosis not present

## 2012-07-08 DIAGNOSIS — M999 Biomechanical lesion, unspecified: Secondary | ICD-10-CM | POA: Diagnosis not present

## 2012-07-08 DIAGNOSIS — M5137 Other intervertebral disc degeneration, lumbosacral region: Secondary | ICD-10-CM | POA: Diagnosis not present

## 2012-07-09 DIAGNOSIS — IMO0002 Reserved for concepts with insufficient information to code with codable children: Secondary | ICD-10-CM | POA: Diagnosis not present

## 2012-07-09 DIAGNOSIS — M545 Low back pain: Secondary | ICD-10-CM | POA: Diagnosis not present

## 2012-07-09 DIAGNOSIS — M1990 Unspecified osteoarthritis, unspecified site: Secondary | ICD-10-CM | POA: Diagnosis not present

## 2012-07-09 DIAGNOSIS — M5137 Other intervertebral disc degeneration, lumbosacral region: Secondary | ICD-10-CM | POA: Diagnosis not present

## 2012-07-09 DIAGNOSIS — M999 Biomechanical lesion, unspecified: Secondary | ICD-10-CM | POA: Diagnosis not present

## 2012-07-13 DIAGNOSIS — I10 Essential (primary) hypertension: Secondary | ICD-10-CM | POA: Diagnosis not present

## 2012-07-13 DIAGNOSIS — M5137 Other intervertebral disc degeneration, lumbosacral region: Secondary | ICD-10-CM | POA: Diagnosis not present

## 2012-07-13 DIAGNOSIS — IMO0002 Reserved for concepts with insufficient information to code with codable children: Secondary | ICD-10-CM | POA: Diagnosis not present

## 2012-07-13 DIAGNOSIS — E785 Hyperlipidemia, unspecified: Secondary | ICD-10-CM | POA: Diagnosis not present

## 2012-07-13 DIAGNOSIS — M999 Biomechanical lesion, unspecified: Secondary | ICD-10-CM | POA: Diagnosis not present

## 2012-07-13 DIAGNOSIS — I2510 Atherosclerotic heart disease of native coronary artery without angina pectoris: Secondary | ICD-10-CM | POA: Diagnosis not present

## 2012-07-13 DIAGNOSIS — M545 Low back pain: Secondary | ICD-10-CM | POA: Diagnosis not present

## 2012-07-13 DIAGNOSIS — I739 Peripheral vascular disease, unspecified: Secondary | ICD-10-CM | POA: Diagnosis not present

## 2012-07-15 DIAGNOSIS — M999 Biomechanical lesion, unspecified: Secondary | ICD-10-CM | POA: Diagnosis not present

## 2012-07-15 DIAGNOSIS — M545 Low back pain: Secondary | ICD-10-CM | POA: Diagnosis not present

## 2012-07-15 DIAGNOSIS — IMO0002 Reserved for concepts with insufficient information to code with codable children: Secondary | ICD-10-CM | POA: Diagnosis not present

## 2012-07-15 DIAGNOSIS — M5137 Other intervertebral disc degeneration, lumbosacral region: Secondary | ICD-10-CM | POA: Diagnosis not present

## 2012-07-20 DIAGNOSIS — M999 Biomechanical lesion, unspecified: Secondary | ICD-10-CM | POA: Diagnosis not present

## 2012-07-20 DIAGNOSIS — IMO0002 Reserved for concepts with insufficient information to code with codable children: Secondary | ICD-10-CM | POA: Diagnosis not present

## 2012-07-20 DIAGNOSIS — M545 Low back pain: Secondary | ICD-10-CM | POA: Diagnosis not present

## 2012-07-20 DIAGNOSIS — M5137 Other intervertebral disc degeneration, lumbosacral region: Secondary | ICD-10-CM | POA: Diagnosis not present

## 2012-07-22 DIAGNOSIS — IMO0002 Reserved for concepts with insufficient information to code with codable children: Secondary | ICD-10-CM | POA: Diagnosis not present

## 2012-07-22 DIAGNOSIS — M999 Biomechanical lesion, unspecified: Secondary | ICD-10-CM | POA: Diagnosis not present

## 2012-07-22 DIAGNOSIS — M545 Low back pain: Secondary | ICD-10-CM | POA: Diagnosis not present

## 2012-07-22 DIAGNOSIS — M5137 Other intervertebral disc degeneration, lumbosacral region: Secondary | ICD-10-CM | POA: Diagnosis not present

## 2012-07-23 DIAGNOSIS — IMO0002 Reserved for concepts with insufficient information to code with codable children: Secondary | ICD-10-CM | POA: Diagnosis not present

## 2012-07-23 DIAGNOSIS — M999 Biomechanical lesion, unspecified: Secondary | ICD-10-CM | POA: Diagnosis not present

## 2012-07-23 DIAGNOSIS — M545 Low back pain: Secondary | ICD-10-CM | POA: Diagnosis not present

## 2012-07-23 DIAGNOSIS — M5137 Other intervertebral disc degeneration, lumbosacral region: Secondary | ICD-10-CM | POA: Diagnosis not present

## 2012-07-27 DIAGNOSIS — I2510 Atherosclerotic heart disease of native coronary artery without angina pectoris: Secondary | ICD-10-CM | POA: Diagnosis not present

## 2012-07-27 DIAGNOSIS — M545 Low back pain: Secondary | ICD-10-CM | POA: Diagnosis not present

## 2012-07-27 DIAGNOSIS — IMO0002 Reserved for concepts with insufficient information to code with codable children: Secondary | ICD-10-CM | POA: Diagnosis not present

## 2012-07-27 DIAGNOSIS — M999 Biomechanical lesion, unspecified: Secondary | ICD-10-CM | POA: Diagnosis not present

## 2012-07-27 DIAGNOSIS — M5137 Other intervertebral disc degeneration, lumbosacral region: Secondary | ICD-10-CM | POA: Diagnosis not present

## 2012-07-29 DIAGNOSIS — M5137 Other intervertebral disc degeneration, lumbosacral region: Secondary | ICD-10-CM | POA: Diagnosis not present

## 2012-07-29 DIAGNOSIS — M999 Biomechanical lesion, unspecified: Secondary | ICD-10-CM | POA: Diagnosis not present

## 2012-07-29 DIAGNOSIS — M545 Low back pain: Secondary | ICD-10-CM | POA: Diagnosis not present

## 2012-07-29 DIAGNOSIS — IMO0002 Reserved for concepts with insufficient information to code with codable children: Secondary | ICD-10-CM | POA: Diagnosis not present

## 2012-07-30 DIAGNOSIS — M999 Biomechanical lesion, unspecified: Secondary | ICD-10-CM | POA: Diagnosis not present

## 2012-07-30 DIAGNOSIS — M545 Low back pain: Secondary | ICD-10-CM | POA: Diagnosis not present

## 2012-07-30 DIAGNOSIS — IMO0002 Reserved for concepts with insufficient information to code with codable children: Secondary | ICD-10-CM | POA: Diagnosis not present

## 2012-07-30 DIAGNOSIS — M5137 Other intervertebral disc degeneration, lumbosacral region: Secondary | ICD-10-CM | POA: Diagnosis not present

## 2012-08-10 DIAGNOSIS — M47817 Spondylosis without myelopathy or radiculopathy, lumbosacral region: Secondary | ICD-10-CM | POA: Diagnosis not present

## 2012-08-10 DIAGNOSIS — M533 Sacrococcygeal disorders, not elsewhere classified: Secondary | ICD-10-CM | POA: Diagnosis not present

## 2012-08-27 DIAGNOSIS — M47817 Spondylosis without myelopathy or radiculopathy, lumbosacral region: Secondary | ICD-10-CM | POA: Diagnosis not present

## 2012-09-24 DIAGNOSIS — M533 Sacrococcygeal disorders, not elsewhere classified: Secondary | ICD-10-CM | POA: Diagnosis not present

## 2012-09-24 DIAGNOSIS — M47817 Spondylosis without myelopathy or radiculopathy, lumbosacral region: Secondary | ICD-10-CM | POA: Diagnosis not present

## 2012-09-28 DIAGNOSIS — M47817 Spondylosis without myelopathy or radiculopathy, lumbosacral region: Secondary | ICD-10-CM | POA: Diagnosis not present

## 2012-09-30 DIAGNOSIS — I712 Thoracic aortic aneurysm, without rupture: Secondary | ICD-10-CM | POA: Diagnosis not present

## 2012-09-30 DIAGNOSIS — C3490 Malignant neoplasm of unspecified part of unspecified bronchus or lung: Secondary | ICD-10-CM | POA: Diagnosis not present

## 2012-09-30 DIAGNOSIS — I2510 Atherosclerotic heart disease of native coronary artery without angina pectoris: Secondary | ICD-10-CM | POA: Diagnosis not present

## 2012-09-30 DIAGNOSIS — M47817 Spondylosis without myelopathy or radiculopathy, lumbosacral region: Secondary | ICD-10-CM | POA: Diagnosis not present

## 2012-10-02 DIAGNOSIS — Z85118 Personal history of other malignant neoplasm of bronchus and lung: Secondary | ICD-10-CM | POA: Diagnosis not present

## 2012-10-02 DIAGNOSIS — Z09 Encounter for follow-up examination after completed treatment for conditions other than malignant neoplasm: Secondary | ICD-10-CM | POA: Diagnosis not present

## 2012-10-05 DIAGNOSIS — M47817 Spondylosis without myelopathy or radiculopathy, lumbosacral region: Secondary | ICD-10-CM | POA: Diagnosis not present

## 2012-10-08 DIAGNOSIS — M47817 Spondylosis without myelopathy or radiculopathy, lumbosacral region: Secondary | ICD-10-CM | POA: Diagnosis not present

## 2012-10-12 DIAGNOSIS — M47817 Spondylosis without myelopathy or radiculopathy, lumbosacral region: Secondary | ICD-10-CM | POA: Diagnosis not present

## 2012-10-15 DIAGNOSIS — M47817 Spondylosis without myelopathy or radiculopathy, lumbosacral region: Secondary | ICD-10-CM | POA: Diagnosis not present

## 2012-10-19 DIAGNOSIS — M47817 Spondylosis without myelopathy or radiculopathy, lumbosacral region: Secondary | ICD-10-CM | POA: Diagnosis not present

## 2012-10-22 DIAGNOSIS — M47817 Spondylosis without myelopathy or radiculopathy, lumbosacral region: Secondary | ICD-10-CM | POA: Diagnosis not present

## 2012-10-26 DIAGNOSIS — M47817 Spondylosis without myelopathy or radiculopathy, lumbosacral region: Secondary | ICD-10-CM | POA: Diagnosis not present

## 2012-11-06 DIAGNOSIS — M47817 Spondylosis without myelopathy or radiculopathy, lumbosacral region: Secondary | ICD-10-CM | POA: Diagnosis not present

## 2012-11-13 DIAGNOSIS — M47817 Spondylosis without myelopathy or radiculopathy, lumbosacral region: Secondary | ICD-10-CM | POA: Diagnosis not present

## 2012-11-17 DIAGNOSIS — M47817 Spondylosis without myelopathy or radiculopathy, lumbosacral region: Secondary | ICD-10-CM | POA: Diagnosis not present

## 2012-11-19 DIAGNOSIS — IMO0002 Reserved for concepts with insufficient information to code with codable children: Secondary | ICD-10-CM | POA: Diagnosis not present

## 2012-11-19 DIAGNOSIS — M47817 Spondylosis without myelopathy or radiculopathy, lumbosacral region: Secondary | ICD-10-CM | POA: Diagnosis not present

## 2012-12-02 DIAGNOSIS — M47817 Spondylosis without myelopathy or radiculopathy, lumbosacral region: Secondary | ICD-10-CM | POA: Diagnosis not present

## 2012-12-02 DIAGNOSIS — M5126 Other intervertebral disc displacement, lumbar region: Secondary | ICD-10-CM | POA: Diagnosis not present

## 2012-12-03 DIAGNOSIS — M5137 Other intervertebral disc degeneration, lumbosacral region: Secondary | ICD-10-CM | POA: Diagnosis not present

## 2012-12-03 DIAGNOSIS — M533 Sacrococcygeal disorders, not elsewhere classified: Secondary | ICD-10-CM | POA: Diagnosis not present

## 2012-12-03 DIAGNOSIS — IMO0002 Reserved for concepts with insufficient information to code with codable children: Secondary | ICD-10-CM | POA: Diagnosis not present

## 2012-12-03 DIAGNOSIS — M47817 Spondylosis without myelopathy or radiculopathy, lumbosacral region: Secondary | ICD-10-CM | POA: Diagnosis not present

## 2012-12-28 DIAGNOSIS — M545 Low back pain: Secondary | ICD-10-CM | POA: Diagnosis not present

## 2013-03-30 DIAGNOSIS — C342 Malignant neoplasm of middle lobe, bronchus or lung: Secondary | ICD-10-CM | POA: Diagnosis not present

## 2013-04-01 DIAGNOSIS — Z85118 Personal history of other malignant neoplasm of bronchus and lung: Secondary | ICD-10-CM | POA: Diagnosis not present

## 2013-04-01 DIAGNOSIS — R05 Cough: Secondary | ICD-10-CM | POA: Diagnosis not present

## 2013-04-01 DIAGNOSIS — R059 Cough, unspecified: Secondary | ICD-10-CM | POA: Diagnosis not present

## 2013-04-01 DIAGNOSIS — Z09 Encounter for follow-up examination after completed treatment for conditions other than malignant neoplasm: Secondary | ICD-10-CM | POA: Diagnosis not present

## 2013-04-07 DIAGNOSIS — E875 Hyperkalemia: Secondary | ICD-10-CM | POA: Diagnosis not present

## 2013-04-09 ENCOUNTER — Encounter: Payer: Self-pay | Admitting: Thoracic Surgery (Cardiothoracic Vascular Surgery)

## 2013-04-09 ENCOUNTER — Encounter: Payer: Self-pay | Admitting: *Deleted

## 2013-04-09 ENCOUNTER — Institutional Professional Consult (permissible substitution) (INDEPENDENT_AMBULATORY_CARE_PROVIDER_SITE_OTHER): Payer: Medicare Other | Admitting: Thoracic Surgery (Cardiothoracic Vascular Surgery)

## 2013-04-09 VITALS — BP 104/75 | HR 66 | Resp 16 | Ht 67.0 in | Wt 176.0 lb

## 2013-04-09 DIAGNOSIS — J449 Chronic obstructive pulmonary disease, unspecified: Secondary | ICD-10-CM | POA: Insufficient documentation

## 2013-04-09 DIAGNOSIS — I712 Thoracic aortic aneurysm, without rupture, unspecified: Secondary | ICD-10-CM | POA: Insufficient documentation

## 2013-04-09 DIAGNOSIS — C801 Malignant (primary) neoplasm, unspecified: Secondary | ICD-10-CM | POA: Insufficient documentation

## 2013-04-09 DIAGNOSIS — I10 Essential (primary) hypertension: Secondary | ICD-10-CM | POA: Insufficient documentation

## 2013-04-09 NOTE — Progress Notes (Signed)
PCP is No primary provider on file. Referring Provider is Dellia Beckwith, MD  Chief Complaint  Patient presents with  . TAA    eval and treat...CT HEST    HPI: Drew [name] is a 72 year old gentleman who is sent for consultation regarding his thoracic aortic aneurysm.  Drew [name] has multiple cardiac risk factors including hypertension and hypercholesterolemia. He has a past history of heavy tobacco abuse although he quit 25 years ago. He does have a history of COPD and also has a history of coronary disease with an MI and 2 stents placed in the same artery in 2003. He also has a history of abdominal aortic aneurysm. He says that he underwent an attempted open surgical repair but the aorta was "irritated". The incision was closed and he subsequently was sent to [facility] for a stent graft. He thinks this was in about 1989.  He also has a history of lung cancer and Dr. Edwyna [name] did a lingulectomy on him in 2011. His CTs time that is being evaluated showed and a descending aortic aneurysm. He recently had a followup CT for the lung cancer on June 17. This showed a questionable increase in the size of the ascending aneurysm.  He has chronic shortness of breath due to COPD. He has not had any chest pain, orthopnea, or peripheral edema. Overall he feels well.   Past Medical History  Diagnosis Date  . Cancer 2011    lung  . COPD (chronic obstructive pulmonary disease)   . 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 Hypertension     Past Surgical History  Procedure Laterality Date  . Abdominal aortic aneurysm repair w/ endoluminal graft  1999  . Coronary stent placement  2003    Family History  Problem Relation Age of Onset  . Heart disease Mother   . Heart disease Father   . Cancer Father     Social History History  Substance Use Topics  . Smoking status: Former Smoker -- 2.00 packs/day for 36 years    Types: Cigarettes  . Smokeless tobacco: Former  Neurosurgeon    Quit date: 04/09/1988  . Alcohol Use: No    Current Outpatient Prescriptions  Medication Sig Dispense Refill  . aspirin 325 MG EC tablet Take 325 mg by mouth daily.      . budesonide-formoterol (SYMBICORT) 80-4.5 MCG/ACT inhaler Inhale 2 puffs into the lungs 2 (two) times daily.      . cetirizine (ZYRTEC) 10 MG tablet Take 10 mg by mouth daily.      . clopidogrel (PLAVIX) 75 MG tablet Take 75 mg by mouth daily.      . cyclobenzaprine (FLEXERIL) 5 MG tablet Take 5 mg by mouth 3 (three) times daily as needed for muscle spasms.      Marland Kitchen losartan (COZAAR) 25 MG tablet Take 25 mg by mouth daily.      . metoprolol (TOPROL-XL) 200 MG 24 hr tablet Take 200 mg by mouth daily.      . pantoprazole (PROTONIX) 40 MG tablet Take 40 mg by mouth daily.      . pseudoephedrine-guaifenesin (MUCINEX D) 60-600 MG per tablet Take 1 tablet by mouth every 12 (twelve) hours.      . simvastatin (ZOCOR) 20 MG tablet Take 20 mg by mouth at bedtime.      . tamsulosin (FLOMAX) 0.4 MG CAPS Take 0.4 mg by mouth daily.      Marland Kitchen tiotropium (SPIRIVA) 18 MCG inhalation capsule  Place 18 mcg into inhaler and inhale daily.      . traZODone (DESYREL) 100 MG tablet Take 100 mg by mouth at bedtime.      . triamcinolone ointment (KENALOG) 0.1 % Apply topically 2 (two) times daily.       No current facility-administered medications for this visit.    Allergies  Allergen Reactions  . Ethanol Hives    ETHANOL, AFTER SHAVE....FOR LONG PERIODS  . Keflex (Cephalexin) Itching  . Neomycin Rash    MAKES SORE WORSE    Review of Systems  Constitutional: Negative for fever, activity change, appetite change and unexpected weight change.  HENT: Positive for hearing loss.   Eyes:       Floaters  Respiratory: Positive for shortness of breath (COPD). Negative for chest tightness.   Cardiovascular: Negative for leg swelling.  Gastrointestinal:       Reflux  Musculoskeletal: Positive for arthralgias.  Skin:       psoriasis  All  other systems reviewed and are negative.    BP 104/75  Pulse 66  Resp 16  Ht 5\' 7"  (1.702 m)  Wt 176 lb (79.833 kg)  BMI 27.56 kg/m2  SpO2 96% Physical Exam  Vitals reviewed. Constitutional: He is oriented to person, place, and time. He appears well-developed and well-nourished. No distress.  HENT:  Head: Normocephalic and atraumatic.  Eyes: EOM are normal. Pupils are equal, round, and reactive to light.  Neck: Neck supple. No thyromegaly present.  No carotid bruits  Cardiovascular: Normal rate, regular rhythm and normal heart sounds.   No murmur heard. 1+ radial, DP bilaterally  Pulmonary/Chest: Effort normal and breath sounds normal. He has no wheezes. He has no rales.  Well healed left incision  Abdominal: Soft. There is no tenderness.  Musculoskeletal: He exhibits no edema.  Lymphadenopathy:    He has no cervical adenopathy.  Neurological: He is alert and oriented to person, place, and time. No cranial nerve deficit.  Skin: Rash noted.     Diagnostic Tests: <HTML><META HTTP-EQUIV="content-type" CONTENT="text/html;charset=utf-8"><TABLE style="HEIGHT: 315px" id=tblTabControl cellSpacing=0><TBODY><TR><TD id=tdLeftSide class=clsTabContentSel></TD><TD class=clsTabContent> </TD><TD class=clsTabContent> </TD><TD id=tdRightSide class=clsTabContent> </TD></TR></TBODY></TABLE><P style="PADDING-LEFT: 0px; HEIGHT: 270px; OVERFLOW: auto" id=divClinicalInfoTab><P align=left><SPAN></SPAN></DIV><TABLE width="100%"><TBODY><TR><TD><LABEL class=Header>Diagnostic report text</LABEL></TD></TR><TR><TD><P id=rptdivY ondblclick=changeFormat(); name="rptdivY"><P>*RADIOLOGY REPORT*<BR> <BR>Clinical Data: Follow up lung cancer status post surgery<BR> <BR>CT CHEST WITH CONTRAST<BR> <BR>Technique: Multidetector CT imaging of the chest was performed<BR>following the standard protocol during bolus administration of<BR>intravenous contrast.<BR> <BR>Contrast: 60 ml Isovue 370 IV<BR> <BR>Comparison:  09/30/2012<BR> <BR>Findings: Postsurgical changes related to prior left upper lobe<BR>wedge resection with associated volume loss. No suspicious<BR>pulmonary nodules. No pleural effusion or pneumothorax.<BR> <BR>Visualized thyroid is unremarkable.<BR> <BR>Heart is normal in size. No pericardial effusion. Coronary<BR>atherosclerosis. Coronary stents.<BR> <BR>Ascending thoracic aortic aneurysm measures 5.0 cm (series 2/image<BR>26), previously 4.8 cm, although this difference may be related to<BR>cardiac motion. Atherosclerotic calcifications of the aortic arch.<BR> <BR>No suspicious mediastinal, hilar, or axillary lymphadenopathy.<BR> <BR>Visualized upper abdomen is notable for an abdominal aortic and<BR>left renal stent, incompletely visualized.<BR> <BR>Degenerative changes of the visualized thoracolumbar spine.<BR> <BR>IMPRESSION:<BR>Postsurgical changes related to prior left upper lobe wedge<BR>resection.<BR> <BR>No evidence of recurrent or metastatic disease in the chest.<BR> <BR>5.0 cm ascending thoracic aortic aneurysm, stable versus minimally<BR>increased.<BR></P></DIV></TD></TR></TBODY></TABLE></DIV>  Impression: Drew [name] is a 73 year old gentleman with multiple cardiac risk factors who has an ascending aortic aneurysm. This measures about 5 cm in greatest diameter. In reviewing his films back to 2011 I do not think this has actually increased in size. When measured at the same site as determined by the adjacent pulmonary artery the aorta is unchanged at about  4.7-4.8 cm at the level of the bifurcation and 5 cm slightly below that.   There is no indication for surgery at this time. However this will need to be carefully followed.  I had a long discussion with Mr. and Mrs. Kissoon and reviewed the films with them. We discussed the risks of rupture and/or dissection. They understand the importance of careful blood pressure control.  I recommended that we repeat a CT in about 6 months just to  be on the safe side. We will schedule that down at [facility].  He has no evidence of recurrence of his lung cancer.  Plan: Return in 6 months with CT angiogram of the chest

## 2013-04-14 DIAGNOSIS — K2270 Barrett's esophagus without dysplasia: Secondary | ICD-10-CM | POA: Diagnosis not present

## 2013-04-14 DIAGNOSIS — E871 Hypo-osmolality and hyponatremia: Secondary | ICD-10-CM | POA: Diagnosis not present

## 2013-04-14 DIAGNOSIS — K5732 Diverticulitis of large intestine without perforation or abscess without bleeding: Secondary | ICD-10-CM | POA: Diagnosis not present

## 2013-04-14 DIAGNOSIS — K219 Gastro-esophageal reflux disease without esophagitis: Secondary | ICD-10-CM | POA: Diagnosis not present

## 2013-07-26 DIAGNOSIS — Z23 Encounter for immunization: Secondary | ICD-10-CM | POA: Diagnosis not present

## 2013-08-04 DIAGNOSIS — R52 Pain, unspecified: Secondary | ICD-10-CM | POA: Diagnosis not present

## 2013-08-04 DIAGNOSIS — R109 Unspecified abdominal pain: Secondary | ICD-10-CM | POA: Diagnosis not present

## 2013-08-13 DIAGNOSIS — R109 Unspecified abdominal pain: Secondary | ICD-10-CM | POA: Diagnosis not present

## 2013-08-13 DIAGNOSIS — N281 Cyst of kidney, acquired: Secondary | ICD-10-CM | POA: Diagnosis not present

## 2013-08-13 DIAGNOSIS — I714 Abdominal aortic aneurysm, without rupture: Secondary | ICD-10-CM | POA: Diagnosis not present

## 2013-09-03 ENCOUNTER — Other Ambulatory Visit: Payer: Self-pay | Admitting: *Deleted

## 2013-09-03 DIAGNOSIS — I712 Thoracic aortic aneurysm, without rupture: Secondary | ICD-10-CM

## 2013-09-14 DIAGNOSIS — I712 Thoracic aortic aneurysm, without rupture: Secondary | ICD-10-CM | POA: Diagnosis not present

## 2013-09-15 ENCOUNTER — Encounter: Payer: Self-pay | Admitting: Thoracic Surgery (Cardiothoracic Vascular Surgery)

## 2013-09-28 ENCOUNTER — Ambulatory Visit (INDEPENDENT_AMBULATORY_CARE_PROVIDER_SITE_OTHER): Payer: Medicare Other | Admitting: Thoracic Surgery (Cardiothoracic Vascular Surgery)

## 2013-09-28 ENCOUNTER — Encounter: Payer: Self-pay | Admitting: Thoracic Surgery (Cardiothoracic Vascular Surgery)

## 2013-09-28 VITALS — BP 133/82 | HR 72 | Resp 16 | Ht 67.0 in | Wt 176.0 lb

## 2013-09-28 DIAGNOSIS — I712 Thoracic aortic aneurysm, without rupture: Secondary | ICD-10-CM | POA: Diagnosis not present

## 2013-09-28 NOTE — Progress Notes (Signed)
HPI:  [name] is a 72 year old gentleman with a 5.0 cm ascending aortic aneurysm noted incidentally on CT done to follow up after lung resection for adenocarcinoma(DPB in 2011)  [name] has multiple cardiac risk factors including hypertension and hypercholesterolemia. He has a past history of heavy tobacco abuse, although he quit 25 years ago. He does have a history of COPD and also has a history of coronary disease with an MI and 2 stents placed in the same artery in 2003. He also has a history of abdominal aortic aneurysm. He says that he underwent an attempted open surgical repair but the aorta was "irritated". The incision was closed and he subsequently was sent to [facility] for a stent graft. He thinks this was in about 1989.   He also has a history of lung cancer and Dr. Edwyna [name] did a lingulectomy on him in 2011. His CT at that time showed and an ascending aortic aneurysm. He recently had a followup CT for the lung cancer on June 17. This showed a questionable increase in the size of the ascending aneurysm. After review and when compared to older scans, I felt the aneurysm was unchanged.  In the interim since his last visit he has been well. He has not had CP, SOB or orthopnea. He has chronic dyspnea due to COPD and his symptoms are worse when it is hot and humid. He has not had any significant issues with his breathing since his last visit.  Past Medical History  Diagnosis Date  . Cancer 2011    lung  . COPD (chronic obstructive pulmonary disease)   . 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 Hypertension       Current Outpatient Prescriptions  Medication Sig Dispense Refill  . aspirin 325 MG EC tablet Take 325 mg by mouth daily.      . budesonide-formoterol (SYMBICORT) 80-4.5 MCG/ACT inhaler Inhale 2 puffs into the lungs 2 (two) times daily.      . cetirizine (ZYRTEC) 10 MG tablet Take 10 mg by mouth daily.      . clopidogrel (PLAVIX) 75 MG  tablet Take 75 mg by mouth daily.      . cyclobenzaprine (FLEXERIL) 5 MG tablet Take 5 mg by mouth 3 (three) times daily as needed for muscle spasms.      Marland Kitchen losartan (COZAAR) 25 MG tablet Take 25 mg by mouth daily.      . metoprolol (TOPROL-XL) 200 MG 24 hr tablet Take 200 mg by mouth daily.      . pantoprazole (PROTONIX) 40 MG tablet Take 40 mg by mouth daily.      . pseudoephedrine-guaifenesin (MUCINEX D) 60-600 MG per tablet Take 1 tablet by mouth every 12 (twelve) hours.      . simvastatin (ZOCOR) 20 MG tablet Take 20 mg by mouth at bedtime.      . tamsulosin (FLOMAX) 0.4 MG CAPS Take 0.4 mg by mouth daily.      Marland Kitchen tiotropium (SPIRIVA) 18 MCG inhalation capsule Place 18 mcg into inhaler and inhale daily.      . traZODone (DESYREL) 100 MG tablet Take 100 mg by mouth at bedtime.      . triamcinolone ointment (KENALOG) 0.1 % Apply topically 2 (two) times daily.       No current facility-administered medications for this visit.    Physical Exam BP 133/82  Pulse 72  Resp 16  Ht 5\' 7"  (1.702 m)  Wt 176 lb (79.833  kg)  BMI 27.56 kg/m2  SpO5 85% 72 year old male in no acute distress Well-developed and well-nourished No cervical or subclavicular adenopathy Neurologic alert and oriented x3 with no focal deficits Lungs diminished breath sounds left base otherwise clear Cardiac regular rate and rhythm normal S1 and S2, no murmur Peripheral pulses intact and symmetrical  Diagnostic Tests:  CT ANGIOGRAPHY CHEST WITH CONTRAST FINDINGS: Maximal aortic diameter at the sinus of Valsalva, sino-tubular junction, and ascending aorta are 4.2 cm, 3.3 cm, and 5.0 cm. Previously, the maximal diameter was 5.0 cm, therefore this is stable. No evidence of dissection.  Stable ectasia of the innominate artery at 27 mm.  Right subclavian artery, innominate artery, right common carotid artery, left common carotid artery, left subclavian artery are patent. Visualized vertebral arteries are patent. The right  vertebral artery is severely diminutive.  There are no obvious filling defects in the pulmonary arterial tree to suggest acute pulmonary thromboembolism.  No evidence of abnormal mediastinal adenopathy or pericardial effusion.   Extensive coronary artery calcifications involving the left main and all 3 vessels are noted.  No pneumothorax or pleural effusion  Postoperative changes in the left hemi thorax with scarring are stable.  No acute bony deformity. 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  Review of the MIP images confirms the above findings.  IMPRESSION: Stable ascending aortic aneurysm with a maximal diameter of 5.0 cm Electronically Signed By: Maryclare Bean M.D. On: 09/14/2013 13:14  Impression: 72 year old gentleman with a 5.0 cm ascending aortic aneurysm. This is unchanged over the past 6 months. I once again discussed with he and his wife that we cut off criteria for surgery in this portion of the aorta is in the 5.5-6 cm range. However, if the lesion did show any interval growth I would consider surgery at that time.  Is a history of lung cancer and underwent a lingulectomy by Dr. Edwyna [name] back in 2011. His CT shows no evidence recurrent disease.  Plan: Repeat CT angiogram in 6 months to followup ascending aorta and rule out recurrent lung cancer.

## 2013-12-11 DIAGNOSIS — J069 Acute upper respiratory infection, unspecified: Secondary | ICD-10-CM | POA: Diagnosis not present

## 2014-01-19 DIAGNOSIS — J309 Allergic rhinitis, unspecified: Secondary | ICD-10-CM | POA: Diagnosis not present

## 2014-02-23 ENCOUNTER — Other Ambulatory Visit: Payer: Self-pay

## 2014-02-23 DIAGNOSIS — I712 Thoracic aortic aneurysm, without rupture, unspecified: Secondary | ICD-10-CM

## 2014-03-01 ENCOUNTER — Other Ambulatory Visit: Payer: Self-pay

## 2014-03-01 DIAGNOSIS — I712 Thoracic aortic aneurysm, without rupture, unspecified: Secondary | ICD-10-CM

## 2014-03-10 DIAGNOSIS — T169XXA Foreign body in ear, unspecified ear, initial encounter: Secondary | ICD-10-CM | POA: Diagnosis not present

## 2014-03-10 DIAGNOSIS — H905 Unspecified sensorineural hearing loss: Secondary | ICD-10-CM | POA: Diagnosis not present

## 2014-03-10 DIAGNOSIS — J342 Deviated nasal septum: Secondary | ICD-10-CM | POA: Diagnosis not present

## 2014-04-11 DIAGNOSIS — I712 Thoracic aortic aneurysm, without rupture, unspecified: Secondary | ICD-10-CM | POA: Diagnosis not present

## 2014-04-11 LAB — BUN: BUN: 11 mg/dL (ref 6–23)

## 2014-04-11 LAB — CREATININE, SERUM: CREATININE: 0.8 mg/dL (ref 0.50–1.35)

## 2014-04-12 ENCOUNTER — Ambulatory Visit
Admission: RE | Admit: 2014-04-12 | Discharge: 2014-04-12 | Disposition: A | Discharge status: Home / Self Care | Payer: Medicare Other | Source: Ambulatory Visit | Attending: Thoracic Surgery (Cardiothoracic Vascular Surgery) | Admitting: Thoracic Surgery (Cardiothoracic Vascular Surgery)

## 2014-04-12 ENCOUNTER — Ambulatory Visit (INDEPENDENT_AMBULATORY_CARE_PROVIDER_SITE_OTHER): Payer: Medicare Other | Admitting: Thoracic Surgery (Cardiothoracic Vascular Surgery)

## 2014-04-12 ENCOUNTER — Encounter: Payer: Self-pay | Admitting: Thoracic Surgery (Cardiothoracic Vascular Surgery)

## 2014-04-12 VITALS — BP 124/72 | HR 68 | Resp 20 | Ht 67.0 in | Wt 175.0 lb

## 2014-04-12 DIAGNOSIS — Z85118 Personal history of other malignant neoplasm of bronchus and lung: Secondary | ICD-10-CM | POA: Diagnosis not present

## 2014-04-12 DIAGNOSIS — I712 Thoracic aortic aneurysm, without rupture, unspecified: Secondary | ICD-10-CM

## 2014-04-12 MED ORDER — IOHEXOL 350 MG/ML SOLN
80.0000 mL | Freq: Once | INTRAVENOUS | Status: AC | PRN
Start: 2014-04-12 — End: 2014-04-12
  Administered 2014-04-12: 80 mL via INTRAVENOUS

## 2014-04-12 NOTE — Progress Notes (Signed)
HPI:  [name] returns today for a scheduled 6 month followup visit.  He is a 73 year old gentleman with a past medical history including hypertension, hypercholesterolemia, and coronary artery disease. He also has a history of heavy tobacco abuse. Although he quit 25 years ago, he does have COPD. He also had a lingulectomy for lung cancer by Dr. Arlyce Dice in 2011. He has had no evidence of recurrent disease since that time.  As part of his followup for his lung cancer he has had regular CTs of the chest. He was noted to have a 5 cm ascending aortic aneurysm on CT last fall. I saw him in December. The aneurysm was not significantly changed and we elected to continue to follow him. He now comes in for 6 month followup scan.  He says that his been feeling well overall. He says his blood pressure is been well-controlled. He does have some wheezing and has been given a Proventil inhaler for when necessary use. His weight has been stable. He has a dry cough. He denies hemoptysis. He has not had any significant weight loss. He denies chest pain.  Current Outpatient Prescriptions  Medication Sig Dispense Refill  . albuterol (PROVENTIL HFA;VENTOLIN HFA) 108 (90 BASE) MCG/ACT inhaler Inhale into the lungs every 6 (six) hours as needed for wheezing or shortness of breath.      Marland Kitchen aspirin 325 MG EC tablet Take 325 mg by mouth daily.      . budesonide-formoterol (SYMBICORT) 80-4.5 MCG/ACT inhaler Inhale 2 puffs into the lungs 2 (two) times daily.      . cetirizine (ZYRTEC) 10 MG tablet Take 10 mg by mouth daily.      . clopidogrel (PLAVIX) 75 MG tablet Take 75 mg by mouth daily.      . cyclobenzaprine (FLEXERIL) 5 MG tablet Take 5 mg by mouth 3 (three) times daily as needed for muscle spasms.      Marland Kitchen losartan (COZAAR) 25 MG tablet Take 25 mg by mouth daily.      . metoprolol (TOPROL-XL) 200 MG 24 hr tablet Take 200 mg by mouth daily.      . pantoprazole (PROTONIX) 40 MG tablet Take 40 mg by mouth daily.       . pseudoephedrine-guaifenesin (MUCINEX D) 60-600 MG per tablet Take 1 tablet by mouth every 12 (twelve) hours.      . sertraline (ZOLOFT) 50 MG tablet Take 50 mg by mouth daily.      . simvastatin (ZOCOR) 20 MG tablet Take 20 mg by mouth at bedtime.      . tamsulosin (FLOMAX) 0.4 MG CAPS Take 0.4 mg by mouth daily.      Marland Kitchen tiotropium (SPIRIVA) 18 MCG inhalation capsule Place 18 mcg into inhaler and inhale daily.      . traZODone (DESYREL) 100 MG tablet Take 100 mg by mouth at bedtime.      . triamcinolone ointment (KENALOG) 0.1 % Apply topically 2 (two) times daily.       No current facility-administered medications for this visit.    Physical Exam BP 124/72  Pulse 68  Resp 20  Ht 5\' 7"  (1.702 m)  Wt 175 lb (79.379 kg)  BMI 27.40 kg/m2  SpO15 86% 73 year old man in no acute distress Well-developed well-nourished Alert and oriented x3 with no focal neurologic deficits Lungs with diminished breath sounds bilaterally no wheezing Cardiac regular rate and rhythm normal S1 and S2 no murmur No carotid bruits  Diagnostic Tests: 04/12/2014 CT ANGIOGRAPHY CHEST  WITH CONTRAST  TECHNIQUE:  Multidetector CT imaging of the chest was performed using the  standard protocol during bolus administration of intravenous  contrast. Multiplanar CT image reconstructions and MIPs were  obtained to evaluate the vascular anatomy.  CONTRAST: 76mL OMNIPAQUE IOHEXOL 350 MG/ML SOLN  COMPARISON: 09/14/2013  FINDINGS:  The thoracic aorta measures 3.2 cm diameter at the sino-tubular  junction, 5.2 cm proximal ascending (previously 5 cm), 4.6 cm distal  ascending/ proximal arch, 3.2 cm distal arch, 3.6 cm proximal  descending, 4.1 cm mid descending, 3.4 distal descending just above  the diaphragm. No dissection or stenosis. Bovine variant  brachiocephalic arterial origin anatomy of with mild plaque, no  significant proximal stenosis. Ectatic right brachiocephalic artery  2 cm diameter.  Satisfactory  opacification of pulmonary arteries noted, and there is  no evidence of pulmonary emboli.  Patchy coronary calcifications.  No pleural or pericardial effusion. No hilar or mediastinal  adenopathy. Stable linear scarring or atelectasis in the anterior  left lower lobe. Surgical clips at the left hilum as before. Right  lung remains clear. Visualized upper abdomen unremarkable. The  proximal margin of aortic stent graft is noted. Bridging osteophytes  across multiple contiguous levels in the lower thoracic spine.  Degenerative disc disease noted in the lower cervical spine.  Review of the MIP images confirms the above findings.  IMPRESSION:  1. Slight enlargement of ascending aortic aneurysm from 5 to 5.2 cm,  without other complicating features.  Electronically Signed  By: Arne Cleveland M.D.  On: 04/12/2014 10:31  Impression: [name] is a 73 year old gentleman with a known ascending aortic aneurysm approximately 5 cm in diameter. On 6 month followup the aneurysm has enlarged slightly. This growth is been on the order of 1-2 mm. There is still no indication for surgery at this time. Indications would include an increase of greater than 5 mm in 6 months or an overall size of 5.5 cm. This does bear continued close followup. I recommended that we repeat a CT in 6 months. He is agreeable to that plan.  There is nothing on CT to indicate recurrence of lung cancer.  Plan:  CT angiogram of chest in 6 months

## 2014-05-11 DIAGNOSIS — K2270 Barrett's esophagus without dysplasia: Secondary | ICD-10-CM | POA: Diagnosis not present

## 2014-05-11 DIAGNOSIS — K219 Gastro-esophageal reflux disease without esophagitis: Secondary | ICD-10-CM | POA: Diagnosis not present

## 2014-05-11 DIAGNOSIS — R1310 Dysphagia, unspecified: Secondary | ICD-10-CM | POA: Diagnosis not present

## 2014-07-12 DIAGNOSIS — Z23 Encounter for immunization: Secondary | ICD-10-CM | POA: Diagnosis not present

## 2014-07-16 DIAGNOSIS — M549 Dorsalgia, unspecified: Secondary | ICD-10-CM | POA: Diagnosis not present

## 2014-07-16 DIAGNOSIS — T148 Other injury of unspecified body region: Secondary | ICD-10-CM | POA: Diagnosis not present

## 2014-07-16 DIAGNOSIS — S3421XA Injury of nerve root of lumbar spine, initial encounter: Secondary | ICD-10-CM | POA: Diagnosis not present

## 2014-07-16 DIAGNOSIS — I10 Essential (primary) hypertension: Secondary | ICD-10-CM | POA: Diagnosis not present

## 2014-07-16 DIAGNOSIS — S300XXA Contusion of lower back and pelvis, initial encounter: Secondary | ICD-10-CM | POA: Diagnosis not present

## 2014-07-16 DIAGNOSIS — S249XXA Injury of unspecified nerve of thorax, initial encounter: Secondary | ICD-10-CM | POA: Diagnosis not present

## 2014-07-16 DIAGNOSIS — J449 Chronic obstructive pulmonary disease, unspecified: Secondary | ICD-10-CM | POA: Diagnosis not present

## 2014-07-16 DIAGNOSIS — Z79899 Other long term (current) drug therapy: Secondary | ICD-10-CM | POA: Diagnosis not present

## 2014-07-16 DIAGNOSIS — S20229A Contusion of unspecified back wall of thorax, initial encounter: Secondary | ICD-10-CM | POA: Diagnosis not present

## 2014-07-16 DIAGNOSIS — J45909 Unspecified asthma, uncomplicated: Secondary | ICD-10-CM | POA: Diagnosis not present

## 2014-07-16 DIAGNOSIS — M545 Low back pain: Secondary | ICD-10-CM | POA: Diagnosis not present

## 2014-07-16 DIAGNOSIS — I2510 Atherosclerotic heart disease of native coronary artery without angina pectoris: Secondary | ICD-10-CM | POA: Diagnosis not present

## 2014-07-16 DIAGNOSIS — W01198A Fall on same level from slipping, tripping and stumbling with subsequent striking against other object, initial encounter: Secondary | ICD-10-CM | POA: Diagnosis not present

## 2014-07-21 DIAGNOSIS — W108XXA Fall (on) (from) other stairs and steps, initial encounter: Secondary | ICD-10-CM | POA: Diagnosis not present

## 2014-07-21 DIAGNOSIS — S300XXA Contusion of lower back and pelvis, initial encounter: Secondary | ICD-10-CM | POA: Diagnosis not present

## 2014-08-16 ENCOUNTER — Other Ambulatory Visit: Payer: Self-pay

## 2014-08-16 DIAGNOSIS — I712 Thoracic aortic aneurysm, without rupture, unspecified: Secondary | ICD-10-CM

## 2014-08-22 ENCOUNTER — Other Ambulatory Visit: Payer: Self-pay

## 2014-08-22 DIAGNOSIS — I712 Thoracic aortic aneurysm, without rupture, unspecified: Secondary | ICD-10-CM

## 2014-09-22 DIAGNOSIS — S40012A Contusion of left shoulder, initial encounter: Secondary | ICD-10-CM | POA: Diagnosis not present

## 2014-09-22 DIAGNOSIS — S20219A Contusion of unspecified front wall of thorax, initial encounter: Secondary | ICD-10-CM | POA: Diagnosis not present

## 2014-09-23 DIAGNOSIS — I712 Thoracic aortic aneurysm, without rupture: Secondary | ICD-10-CM | POA: Diagnosis not present

## 2014-09-23 LAB — CREATININE, SERUM: CREATININE: 0.82 mg/dL (ref 0.50–1.35)

## 2014-09-23 LAB — BUN: BUN: 14 mg/dL (ref 6–23)

## 2014-09-27 ENCOUNTER — Encounter (INDEPENDENT_AMBULATORY_CARE_PROVIDER_SITE_OTHER): Payer: Self-pay

## 2014-09-27 ENCOUNTER — Ambulatory Visit
Admission: RE | Admit: 2014-09-27 | Discharge: 2014-09-27 | Disposition: A | Discharge status: Home / Self Care | Payer: Medicare Other | Source: Ambulatory Visit | Attending: Thoracic Surgery (Cardiothoracic Vascular Surgery) | Admitting: Thoracic Surgery (Cardiothoracic Vascular Surgery)

## 2014-09-27 ENCOUNTER — Ambulatory Visit (INDEPENDENT_AMBULATORY_CARE_PROVIDER_SITE_OTHER): Payer: Medicare Other | Admitting: Thoracic Surgery (Cardiothoracic Vascular Surgery)

## 2014-09-27 ENCOUNTER — Encounter: Payer: Self-pay | Admitting: Thoracic Surgery (Cardiothoracic Vascular Surgery)

## 2014-09-27 VITALS — BP 101/69 | HR 94 | Resp 16 | Ht 67.0 in | Wt 175.0 lb

## 2014-09-27 DIAGNOSIS — I712 Thoracic aortic aneurysm, without rupture, unspecified: Secondary | ICD-10-CM

## 2014-09-27 DIAGNOSIS — I7121 Aneurysm of the ascending aorta, without rupture: Secondary | ICD-10-CM

## 2014-09-27 MED ORDER — IOHEXOL 350 MG/ML SOLN
75.0000 mL | Freq: Once | INTRAVENOUS | Status: AC | PRN
  Administered 2014-09-27: 75 mL via INTRAVENOUS

## 2014-09-27 NOTE — Progress Notes (Signed)
HPI:  [name] returns today for a scheduled 6 month follow-up visit.  He is a 73 year old gentleman with a history of COPD and prior lingular resection for lung cancer. He was being followed with routine CT scans for his lung cancer. In 2014 was noted that he had a 5 cm ascending aortic aneurysm. In retrospect going back to 2011 he had an aneurysm back to that time.  At his last visit in June the aneurysm was felt to have increased in size from 5.0-5.2 cm. He now returns for 6 month follow-up.  He has not been having any chest pain. He does have some issues with shortness of breath related to his COPD. That is primarily related to humidity. His weight has been stable. He has not had any unusual headaches or visual changes.  Past Medical History  Diagnosis Date  . Cancer 2011    lung  . COPD (chronic obstructive pulmonary disease)   . 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 Hypertension       Current Outpatient Prescriptions  Medication Sig Dispense Refill  . albuterol (PROVENTIL HFA;VENTOLIN HFA) 108 (90 BASE) MCG/ACT inhaler Inhale into the lungs every 6 (six) hours as needed for wheezing or shortness of breath.    Marland Kitchen aspirin 325 MG EC tablet Take 325 mg by mouth daily.    . budesonide-formoterol (SYMBICORT) 80-4.5 MCG/ACT inhaler Inhale 2 puffs into the lungs 2 (two) times daily.    . cetirizine (ZYRTEC) 10 MG tablet Take 10 mg by mouth daily.    . clopidogrel (PLAVIX) 75 MG tablet Take 75 mg by mouth daily.    . cyclobenzaprine (FLEXERIL) 5 MG tablet Take 5 mg by mouth 3 (three) times daily as needed for muscle spasms.    Marland Kitchen losartan (COZAAR) 25 MG tablet Take 25 mg by mouth daily.    . metoprolol (TOPROL-XL) 200 MG 24 hr tablet Take 200 mg by mouth daily.    . pantoprazole (PROTONIX) 40 MG tablet Take 40 mg by mouth daily.    . pseudoephedrine-guaifenesin (MUCINEX D) 60-600 MG per tablet Take 1 tablet by mouth every 12 (twelve) hours.    . sertraline  (ZOLOFT) 50 MG tablet Take 50 mg by mouth daily.    . simvastatin (ZOCOR) 20 MG tablet Take 20 mg by mouth at bedtime.    . tamsulosin (FLOMAX) 0.4 MG CAPS Take 0.4 mg by mouth daily.    Marland Kitchen tiotropium (SPIRIVA) 18 MCG inhalation capsule Place 18 mcg into inhaler and inhale daily.    . traZODone (DESYREL) 100 MG tablet Take 100 mg by mouth at bedtime.    . triamcinolone ointment (KENALOG) 0.1 % Apply topically 2 (two) times daily.     No current facility-administered medications for this visit.    Physical Exam BP 101/69 mmHg  Pulse 94  Resp 16  Ht 5\' 7"  (1.702 m)  Wt 175 lb (79.379 kg)  BMI 27.40 kg/m2  SpO45 8% 73 year old man in no acute distress Well-developed and well-nourished Alert and oriented 3 with no focal neurologic deficits Lungs with this of breath sounds bilaterally, no wheezing Cardiac regular rate and rhythm 2/6 systolic murmur  Diagnostic Tests: CT ANGIOGRAPHY CHEST WITH CONTRAST  TECHNIQUE: Multidetector CT imaging of the chest was performed using the standard protocol during bolus administration of intravenous contrast. Multiplanar CT image reconstructions and MIPs were obtained to evaluate the vascular anatomy.  CONTRAST: 53mL OMNIPAQUE IOHEXOL 350 MG/ML SOLN  COMPARISON: 04/12/2014  FINDINGS: Maximal  diameters of the ascending aorta at the sinus of Valsalva, sino-tubular junction, and ascending aorta are 4.1 cm, 3.0 cm, and 5.2 cm. Previous maximal diameter was 5.2 cm there is no evidence of dissection or obvious intramural hematoma. Mixing artifact in the descending aorta is noted, within the nondependent portion of the blood column.  Innominate artery dilatation measures 2.3 cm. This is stable based on my direct measurements. Innominate artery is patent. Mild narrowing at the origin of the right subclavian artery. Right vertebral artery is patent and very diminutive. Right common carotid, left common carotid via bovine origin, and left  subclavian artery are patent. Atherosclerotic change at the origin of the left vertebral artery. It is patent with tortuosity.  There is no obvious filling defect in the pulmonary arterial tree to suggest acute pulmonary thromboembolism.  No abnormal mediastinal adenopathy. Three vessel coronary artery calcifications. Left main coronary artery calcification.  No pneumothorax or pleural effusion.  4 mm left lower lobe pulmonary nodule is stable dating back to 2013.  No acute bony deformity. Posterior decompression and anterior fusion in the lower cervical spine has been performed.  Stable sub cm liver lesion.  Review of the MIP images confirms the above findings.  IMPRESSION: Ascending thoracic aortic aneurysm with a maximal diameter of 5.2 cm is stable and without evidence of dissection.   Electronically Signed  By: Maryclare Bean M.D.  On: 09/27/2014 09:21  Impression: 73 year old gentleman with a history of remote tobacco abuse, COPD, lung cancer resected in 2011, hypertension, and the thoracic aortic aneurysm. He is being followed for a 5.2 cm ascending aortic aneurysm. The aneurysm was read as stable and unchanged on today's CT scan.  He is doing well at this point in time. His blood pressure is well controlled.  We do need to continue to follow this closely and will plan to do a repeat CT angiogram in 6 months.

## 2014-12-29 DIAGNOSIS — I10 Essential (primary) hypertension: Secondary | ICD-10-CM | POA: Diagnosis not present

## 2014-12-29 DIAGNOSIS — E780 Pure hypercholesterolemia: Secondary | ICD-10-CM | POA: Diagnosis not present

## 2014-12-29 DIAGNOSIS — Z0000 Encounter for general adult medical examination without abnormal findings: Secondary | ICD-10-CM | POA: Diagnosis not present

## 2014-12-29 DIAGNOSIS — D519 Vitamin B12 deficiency anemia, unspecified: Secondary | ICD-10-CM | POA: Diagnosis not present

## 2014-12-29 DIAGNOSIS — F419 Anxiety disorder, unspecified: Secondary | ICD-10-CM | POA: Diagnosis not present

## 2014-12-29 DIAGNOSIS — Z79899 Other long term (current) drug therapy: Secondary | ICD-10-CM | POA: Diagnosis not present

## 2014-12-29 DIAGNOSIS — N509 Disorder of male genital organs, unspecified: Secondary | ICD-10-CM | POA: Diagnosis not present

## 2014-12-29 DIAGNOSIS — R5383 Other fatigue: Secondary | ICD-10-CM | POA: Diagnosis not present

## 2014-12-29 DIAGNOSIS — Z1389 Encounter for screening for other disorder: Secondary | ICD-10-CM | POA: Diagnosis not present

## 2014-12-29 DIAGNOSIS — Z9181 History of falling: Secondary | ICD-10-CM | POA: Diagnosis not present

## 2015-01-02 DIAGNOSIS — J449 Chronic obstructive pulmonary disease, unspecified: Secondary | ICD-10-CM | POA: Diagnosis not present

## 2015-01-02 DIAGNOSIS — M1990 Unspecified osteoarthritis, unspecified site: Secondary | ICD-10-CM | POA: Diagnosis not present

## 2015-01-04 DIAGNOSIS — J984 Other disorders of lung: Secondary | ICD-10-CM | POA: Diagnosis not present

## 2015-01-04 DIAGNOSIS — F419 Anxiety disorder, unspecified: Secondary | ICD-10-CM | POA: Diagnosis not present

## 2015-01-04 DIAGNOSIS — N433 Hydrocele, unspecified: Secondary | ICD-10-CM | POA: Diagnosis not present

## 2015-01-09 DIAGNOSIS — J309 Allergic rhinitis, unspecified: Secondary | ICD-10-CM | POA: Diagnosis not present

## 2015-01-09 DIAGNOSIS — J0100 Acute maxillary sinusitis, unspecified: Secondary | ICD-10-CM | POA: Diagnosis not present

## 2015-01-27 ENCOUNTER — Encounter (INDEPENDENT_AMBULATORY_CARE_PROVIDER_SITE_OTHER): Payer: Self-pay

## 2015-01-27 ENCOUNTER — Ambulatory Visit (INDEPENDENT_AMBULATORY_CARE_PROVIDER_SITE_OTHER): Payer: Medicare Other | Admitting: Internal Medicine

## 2015-01-27 ENCOUNTER — Encounter: Payer: Self-pay | Admitting: Internal Medicine

## 2015-01-27 VITALS — BP 144/86 | HR 87 | Ht 67.0 in | Wt 175.4 lb

## 2015-01-27 DIAGNOSIS — J449 Chronic obstructive pulmonary disease, unspecified: Secondary | ICD-10-CM | POA: Diagnosis not present

## 2015-01-27 HISTORY — DX: Chronic obstructive pulmonary disease, unspecified: J44.9

## 2015-01-27 NOTE — Progress Notes (Addendum)
   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   01/27/2015 1st [facility] office visit/ Rayven Rettig   Chief Complaint  Patient presents with  . Pulmonary Consult    Referred by Dr. Lovette Cliche.  Pt states dxed with COPD approx 4 yrs ago. He states gets SOB walking approx 100 yrds at a normal pace on flat surface. He also gets winded when taking out the trash. He also c/o prod cough with moderate yellow sputum.  He is using albuterol inhaler 1-2 x per wk on average.    Indolent onset minimally progressive daily doe / takes  spiriva and doesn't take symbicort later that am  Does wake up with lots of am congestion / wheezing and urge to clear throat all day assoc with mild dysphagia Not much better p albuterol   No obvious other patterns in day to day or daytime variabilty or assoc chronic cough or cp or chest tightness, subjective wheeze overt sinus or hb symptoms. No unusual exp hx or h/o childhood pna/ asthma or knowledge of premature birth.  Sleeping ok without nocturnal  or early am exacerbation  of respiratory  c/o's or need for noct saba. Also denies any obvious fluctuation of symptoms with weather or environmental changes or other aggravating or alleviating factors except as outlined above   Current Medications, Allergies, Complete Past Medical History, Past Surgical History, Family History, and Social History were reviewed in [employer] record.           Review of Systems  Constitutional: Negative for fever, chills, activity change, appetite change and unexpected weight change.  HENT: Positive for trouble swallowing. Negative for congestion, dental problem, postnasal drip, rhinorrhea, sneezing, sore throat and voice change.   Eyes: Negative for visual  disturbance.  Respiratory: Positive for cough and shortness of breath. Negative for choking.   Cardiovascular: Negative for chest pain and leg swelling.  Gastrointestinal: Negative for nausea, vomiting and abdominal pain.  Genitourinary: Negative for difficulty urinating.  Musculoskeletal: Negative for arthralgias.  Skin: Negative for rash.  Psychiatric/Behavioral: Negative for behavioral problems and confusion.       Objective:   Physical Exam Pleasant wm nad  Wt Readings from Last 3 Encounters:  01/27/15 175 lb 6.4 oz (79.561 kg)  09/27/14 175 lb (79.379 kg)  04/12/14 175 lb (79.379 kg)    Vital signs reviewed  HEENT: nl dentition, turbinates, and orophanx. Nl external ear canals without cough reflex   NECK :  without JVD/Nodes/TM/ nl carotid upstrokes bilaterally   LUNGS: no acc muscle use, clear to A and P bilaterally without cough on insp or exp maneuvers   CV:  RRR  no s3 or murmur or increase in P2, no edema   ABD:  soft and nontender with nl excursion in the supine position. No bruits or organomegaly, bowel sounds nl  MS:  warm without deformities, calf tenderness, cyanosis or clubbing  SKIN: warm and dry without lesions    NEURO:  alert, approp, no deficits    No recent cxr on file               Assessment & Plan:

## 2015-01-27 NOTE — Patient Instructions (Addendum)
Plan A = automatic = Symbicort 160 Take 2 puffs first thing in am and then another 2 puffs about 12 hours later.                                    Spiriva each                                    Pantoprazole 40 mg Take 30-60 min before first meal of the day and Pepcid 20 mg at bedtime    Plan B = backup Only use your albuterol (proventil) as a rescue medication to be used if you can't catch your breath by resting or doing a relaxed purse lip breathing pattern.  - The less you use it, the better it will work when you need it. - Ok to use up to 2 puffs  every 4 hours if you must but call for immediate appointment if use goes up over your usual need - Don't leave home without it !!  (think of it like the spare tire for your car)     GERD (REFLUX)  is an extremely common cause of respiratory symptoms just like yours , many times with no obvious heartburn at all.    It can be treated with medication, but also with lifestyle changes including avoidance of late meals, excessive alcohol, smoking cessation, and avoid fatty foods, chocolate, peppermint, colas, red wine, and acidic juices such as orange juice.  NO MINT OR MENTHOL PRODUCTS SO NO COUGH DROPS  USE SUGARLESS CANDY INSTEAD (Jolley ranchers or Stover's or Life Savers) or even ice chips will also do - the key is to swallow to prevent all throat clearing. NO OIL BASED VITAMINS - use powdered substitutes.    Please schedule a follow up office visit in 4 weeks, sooner if needed with pfts when return bring all inhalers with you  Late add no recent cxr in system  Need to explore post op status  in 2011 to now

## 2015-01-29 ENCOUNTER — Encounter: Payer: Self-pay | Admitting: Internal Medicine

## 2015-01-29 DIAGNOSIS — R5383 Other fatigue: Secondary | ICD-10-CM | POA: Diagnosis not present

## 2015-01-29 DIAGNOSIS — F419 Anxiety disorder, unspecified: Secondary | ICD-10-CM | POA: Diagnosis not present

## 2015-01-29 NOTE — Assessment & Plan Note (Addendum)
  When respiratory symptoms begin or become refractory well after a patient reports complete smoking cessation,  Especially when this wasn't the case while they were smoking, a red flag is raised based on the work of Dr Kris Mouton which states:  if you quit smoking when your best day FEV1 is still well preserved it is highly unlikely you will progress to severe disease.  That is to say, once the smoking stops,  the symptoms should not suddenly erupt or markedly worsen.  If so, the differential diagnosis should include  obesity/deconditioning,  LPR/Reflux/Aspiration syndromes,  occult CHF, or  especially side effect of medications commonly used in this population.    Obesity/ deconditioning are the usual problem but do not appear to be the case here  LPR would be at the top of the usual list of suspects so rec max rx for this plus diet then return for pfts  Office notes from Dr Lin Landsman indicate concern re restricitive lung dz that need to be sorted out with pfts same day as cxr to compare lung volumes   Each maintenance medication was reviewed in detail including most importantly the difference between maintenance and as needed and under what circumstances the prns are to be used.  Please see instructions for details which were reviewed in writing and the patient given a copy.

## 2015-02-27 DIAGNOSIS — I2510 Atherosclerotic heart disease of native coronary artery without angina pectoris: Secondary | ICD-10-CM | POA: Diagnosis not present

## 2015-02-27 DIAGNOSIS — I10 Essential (primary) hypertension: Secondary | ICD-10-CM | POA: Diagnosis not present

## 2015-02-27 DIAGNOSIS — E785 Hyperlipidemia, unspecified: Secondary | ICD-10-CM | POA: Diagnosis not present

## 2015-02-27 DIAGNOSIS — I7102 Dissection of abdominal aorta: Secondary | ICD-10-CM | POA: Diagnosis not present

## 2015-03-01 ENCOUNTER — Ambulatory Visit (INDEPENDENT_AMBULATORY_CARE_PROVIDER_SITE_OTHER): Payer: Medicare Other | Admitting: Internal Medicine

## 2015-03-01 ENCOUNTER — Encounter: Payer: Self-pay | Admitting: Internal Medicine

## 2015-03-01 DIAGNOSIS — R0600 Dyspnea, unspecified: Secondary | ICD-10-CM | POA: Diagnosis not present

## 2015-03-01 DIAGNOSIS — J449 Chronic obstructive pulmonary disease, unspecified: Secondary | ICD-10-CM

## 2015-03-01 HISTORY — DX: Dyspnea, unspecified: R06.00

## 2015-03-01 LAB — PULMONARY FUNCTION TEST
DL/VA % pred: 105 %
DL/VA: 4.57 ml/min/mmHg/L
DLCO UNC: 15.89 ml/min/mmHg
DLCO unc % pred: 58 %
FEF 25-75 POST: 1.46 L/s
FEF 25-75 Pre: 1.1 L/sec
FEF2575-%Change-Post: 31 %
FEF2575-%Pred-Post: 74 %
FEF2575-%Pred-Pre: 56 %
FEV1-%CHANGE-POST: 3 %
FEV1-%PRED-PRE: 58 %
FEV1-%Pred-Post: 60 %
FEV1-POST: 1.59 L
FEV1-Pre: 1.53 L
FEV1FVC-%CHANGE-POST: 4 %
FEV1FVC-%Pred-Pre: 104 %
FEV6-%Change-Post: -1 %
FEV6-%Pred-Post: 58 %
FEV6-%Pred-Pre: 58 %
FEV6-POST: 1.98 L
FEV6-PRE: 2.01 L
FEV6FVC-%Change-Post: 0 %
FEV6FVC-%Pred-Post: 107 %
FEV6FVC-%Pred-Pre: 107 %
FVC-%Change-Post: 0 %
FVC-%Pred-Post: 54 %
FVC-%Pred-Pre: 55 %
FVC-Post: 1.99 L
FVC-Pre: 2.01 L
PRE FEV1/FVC RATIO: 76 %
PRE FEV6/FVC RATIO: 100 %
Post FEV1/FVC ratio: 80 %
Post FEV6/FVC ratio: 100 %
RV % pred: 66 %
RV: 1.53 L
TLC % pred: 58 %
TLC: 3.67 L

## 2015-03-01 NOTE — Progress Notes (Signed)
PFT done today. 

## 2015-03-01 NOTE — Patient Instructions (Addendum)
Stop using the spiriva to see over the next few weeks if your cough improves   Think of spiriva like high octane fuel - ok to stop it to see how you perform without it and if no difference then don't use it     If not better we need to schedule for a formal cpst - call Libby at [phone] to schedule

## 2015-03-01 NOTE — Progress Notes (Signed)
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 Pulmonary Consult    Referred by Dr. Lovette Cliche.  Pt states dxed with COPD approx 4 yrs ago. He states gets SOB walking approx 100 yrds at a normal pace on flat surface. He also gets winded when taking out the trash. He also c/o prod cough with moderate yellow sputum.  He is using albuterol inhaler 1-2 x per wk on average.   1 y hx  Indolent onset minimally progressive daily doe / takes  spiriva and doesn't take symbicort later that am  Does wake up with lots of am congestion / wheezing and urge to clear throat all day assoc with mild dysphagia Not much better p albuterol  Baseline = doe x 100 yards to mailbox slt uphill s stopping / walks nl pace on a level ground rec Plan A = automatic = Symbicort 160 Take 2 puffs first thing in am and then another 2 puffs about 12 hours later.                                    Spiriva each                                    Pantoprazole 40 mg Take 30-60 min before first meal of the day and Pepcid 20 mg at bedtime  Plan B = backup Only use your albuterol (proventil)  GERD diet     03/01/2015 f/u ov/Hinda Lindor re: GOLD II copd  Chief Complaint  Patient presents with  . Follow-up    PFT done today. Pt states that his breathing has improved some since the last visit. Cough is unchanged. He is using albuterol 1 x per wk on average.   Doing better getting going in am in terms of sob/ assoc with dry raspy cough ? Worse on spiriva  Sleeping fine at hs  Some worse in hot humid weather Some better p albuterol   No obvious day to day  or daytime variabilty or assoc cp or chest tightness, subjective wheeze overt sinus or hb symptoms. No unusual exp hx or h/o childhood pna/ asthma or knowledge of premature birth.  Sleeping ok without nocturnal  or early am exacerbation  of respiratory  c/o's or need for noct saba. Also denies any obvious fluctuation of symptoms with weather or environmental changes or other aggravating or alleviating factors except as outlined above   Current Medications, Allergies, Complete Past Medical History, Past Surgical History, Family History, and Social History were reviewed in [employer] record.  ROS  The following are not active complaints unless bolded sore throat, dysphagia, dental problems, itching, sneezing,  nasal congestion or excess/ purulent secretions, ear ache,   fever, chills, sweats, unintended wt loss, pleuritic or exertional cp, hemoptysis,  orthopnea pnd or leg swelling, presyncope, palpitations, heartburn, abdominal pain, anorexia, nausea, vomiting, diarrhea  or change in bowel or urinary habits, change in stools or urine, dysuria,hematuria,  rash, arthralgias, visual complaints, headache, numbness weakness or ataxia or problems with walking or coordination,  change in mood/affect or memory.           Objective:   Physical Exam  Pleasant wm nad/ occ throat clearing / dry raspy voice texture   03/01/2015        175  Wt Readings from Last 3 Encounters:  01/27/15 175 lb 6.4 oz (79.561 kg)  09/27/14 175 lb (79.379 kg)  04/12/14 175 lb (79.379 kg)    Vital signs reviewed  HEENT: nl dentition, turbinates, and orophanx. Nl external ear canals without cough reflex   NECK :  without JVD/Nodes/TM/ nl carotid upstrokes bilaterally   LUNGS: no acc muscle use, clear to A and P bilaterally without cough on insp or exp maneuvers   CV:  RRR  no s3 or murmur or increase in P2, no edema   ABD:  soft and nontender with nl excursion in the supine position. No bruits  or organomegaly, bowel sounds nl  MS:  warm without deformities, calf tenderness, cyanosis or clubbing  SKIN: warm and dry without lesions    NEURO:  alert, approp, no deficits    No recent cxr on file   Had blood work by Lin Landsman 01/2015 ok per pt> requested               Assessment & Plan:

## 2015-03-01 NOTE — Assessment & Plan Note (Signed)
-    03/01/2015  Walked RA x 3 laps @ 185 ft each stopped due to  End of study, no sob or desat - pfts 03/01/2015  Restrictive only with ERV 36%   I had an extended discussion with the patient reviewing all relevant studies completed to date and  lasting 15 to 20 minutes of a 25 minute visit on the following ongoing concerns:  1) no evidence of residual airflow obst so this is not copd but could be chronic mild asthma component  so ok to try off spiriva but leave on symbicort for now   2) upper airway cough may paradoxically improve off dpi  3) may need cpst to sort out cause of sob as he says his wt is not changed since 2011 and doe just started in last year - even so it would help him to lose 15-25 lbs if possible based on today's pfts   4) Each maintenance medication was reviewed in detail including most importantly the difference between maintenance and as needed and under what circumstances the prns are to be used.  Please see instructions for details which were reviewed in writing and the patient given a copy.

## 2015-03-15 ENCOUNTER — Other Ambulatory Visit: Payer: Self-pay | Admitting: *Deleted

## 2015-03-15 DIAGNOSIS — I719 Aortic aneurysm of unspecified site, without rupture: Secondary | ICD-10-CM | POA: Diagnosis not present

## 2015-03-17 LAB — BUN: BUN: 13 mg/dL (ref 6–23)

## 2015-03-17 LAB — CREATININE, SERUM: Creat: 0.82 mg/dL (ref 0.50–1.35)

## 2015-03-21 ENCOUNTER — Encounter: Payer: Self-pay | Admitting: Thoracic Surgery (Cardiothoracic Vascular Surgery)

## 2015-03-21 ENCOUNTER — Ambulatory Visit
Admission: RE | Admit: 2015-03-21 | Discharge: 2015-03-21 | Disposition: A | Discharge status: Home / Self Care | Payer: Medicare Other | Source: Ambulatory Visit | Attending: Thoracic Surgery (Cardiothoracic Vascular Surgery) | Admitting: Thoracic Surgery (Cardiothoracic Vascular Surgery)

## 2015-03-21 ENCOUNTER — Ambulatory Visit (INDEPENDENT_AMBULATORY_CARE_PROVIDER_SITE_OTHER): Payer: Medicare Other | Admitting: Thoracic Surgery (Cardiothoracic Vascular Surgery)

## 2015-03-21 VITALS — BP 143/96 | HR 69 | Resp 16 | Ht 67.0 in | Wt 175.0 lb

## 2015-03-21 DIAGNOSIS — I712 Thoracic aortic aneurysm, without rupture: Secondary | ICD-10-CM

## 2015-03-21 DIAGNOSIS — Z85118 Personal history of other malignant neoplasm of bronchus and lung: Secondary | ICD-10-CM

## 2015-03-21 DIAGNOSIS — I719 Aortic aneurysm of unspecified site, without rupture: Secondary | ICD-10-CM

## 2015-03-21 DIAGNOSIS — I7121 Aneurysm of the ascending aorta, without rupture: Secondary | ICD-10-CM

## 2015-03-21 MED ORDER — IOPAMIDOL (ISOVUE-370) INJECTION 76%
75.0000 mL | Freq: Once | INTRAVENOUS | Status: AC | PRN
  Administered 2015-03-21: 75 mL via INTRAVENOUS

## 2015-03-21 NOTE — Progress Notes (Signed)
[address]       [place] [address]             [phone]       HPI:  [name] returns today for a scheduled 6 month follow-up visit.  He is a 74 year old gentleman with a history of COPD and prior lingular resection for lung cancer. He was being followed with routine CT scans for his lung cancer. In 2014 it was noted that he had a 5 cm ascending aortic aneurysm. In retrospect going back to 2011 he had the aneurysm back at that time.  In June 2015 radiology noted the aneurysm had increased in size from 5.0 to 5.2 cm. I saw him again in December 2015, and it was unchanged.  He says that he's been feeling well. He has shortness of breath and cough related to COPD. That is unchanged. His weight has been stable. He denies any chest pain, pressure, or tightness. He has a blood pressure cuff at home but has not been checking his blood pressure on a regular basis.   Past Medical History  Diagnosis Date  . Cancer 2011    lung  . COPD (chronic obstructive pulmonary disease)   . 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 Hypertension       Current Outpatient Prescriptions  Medication Sig Dispense Refill  . albuterol (PROVENTIL HFA;VENTOLIN HFA) 108 (90 BASE) MCG/ACT inhaler Inhale into the lungs every 6 (six) hours as needed for wheezing or shortness of breath.    Marland Kitchen aspirin 325 MG EC tablet Take 325 mg by mouth daily.    . budesonide-formoterol (SYMBICORT) 80-4.5 MCG/ACT inhaler Inhale 2 puffs into the lungs 2 (two) times daily.    . clopidogrel (PLAVIX) 75 MG tablet Take 75 mg by mouth daily.    . cyclobenzaprine (FLEXERIL) 5 MG tablet Take 5 mg by mouth 3 (three) times daily as needed for muscle spasms.    . famotidine (PEPCID) 20 MG tablet Take 20 mg by mouth at bedtime.    . finasteride (PROSCAR) 5 MG tablet Take 5 mg by mouth daily.    Marland Kitchen losartan (COZAAR) 25 MG tablet Take 25 mg by mouth daily.    . metoprolol (TOPROL-XL) 200 MG 24  hr tablet Take 200 mg by mouth daily.    . pantoprazole (PROTONIX) 40 MG tablet Take 40 mg by mouth daily.    . simvastatin (ZOCOR) 20 MG tablet Take 20 mg by mouth at bedtime.    . tamsulosin (FLOMAX) 0.4 MG CAPS Take 0.4 mg by mouth daily.    . traZODone (DESYREL) 100 MG tablet Take 100 mg by mouth at bedtime.     No current facility-administered medications for this visit.    Physical Exam BP 143/96 mmHg  Pulse 69  Resp 16  Ht '5\' 7"'$  (1.702 m)  Wt 175 lb (79.379 kg)  BMI 27.40 kg/m2  SpO41 51% 74 year old man in no acute distress Well-developed well-nourished No carotid bruits, no cervical or subclavicular adenopathy Cardiac regular rate and rhythm normal S1 and S2 no rubs or murmurs Lungs diminished breath sounds bilaterally, otherwise clear, no wheezes or rales No peripheral edema  Diagnostic Tests: CT ANGIOGRAPHY CHEST WITH CONTRAST  TECHNIQUE: Multidetector CT imaging of the chest was performed using the standard protocol during bolus administration of intravenous contrast. Multiplanar CT image reconstructions and MIPs were obtained to evaluate the vascular anatomy.  CONTRAST: 75 mL Isovue 370 IV  COMPARISON: 09/27/2014  FINDINGS: The thoracic aorta measures 4.1 cm transverse diameter at the sinuses of Valsalva, 3.1 cm sino-tubular junction, 5.3 cm proximal ascending, 4.6 cm distal ascending/ proximal arch, 3.2 cm distal arch, 3.8 cm proximal descending, with fusiform dilatation to 4.2 cm in the mid descending segment, tapering down to a 3.8 cm above the diaphragm, 3.3 cm in the supra celiac segment. No dissection or stenosis. There is bovine variant brachiocephalic arterial origin anatomy without proximal stenosis. 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  Moderate coronary calcifications.  No pleural or pericardial effusion. No hilar or mediastinal adenopathy. Lungs are clear. Bridging osteophytes  across multiple levels in the lower thoracic spine. Fixation hardware at in the cervical spine, incompletely visualized. Sternum intact. Stable benign low-attenuation lesion in hepatic segment 7, stable since 02/21/2011. Remainder of Visualized portions of upper abdomen unremarkable.  Review of the MIP images confirms the above findings.  IMPRESSION: 1. 5.3 cm ascending aortic aneurysm without complicating features (previously 5.2 cm).   Electronically Signed  By: Lucrezia Europe M.D.  On: 03/21/2015 14:04           Vitals     Height Weight BMI (Calculated)    '5\' 7"'$  (1.702 m) 175 lb (79.379 kg) 27.5      Interpretation Summary     CLINICAL DATA: TAA, prev in pacs, hx of left lung cancer with LLL resection, hx of coronary stents, former smoker, productive cough, patient has COPD  EXAM: CT ANGIOGRAPHY CHEST WITH CONTRAST  TECHNIQUE: Multidetector CT imaging of the chest was performed using the standard protocol during bolus administration of intravenous contrast. Multiplanar CT image reconstructions and MIPs were obtained to evaluate the vascular anatomy.  CONTRAST: 75 mL Isovue 370 IV  COMPARISON: 09/27/2014  FINDINGS: The thoracic aorta measures 4.1 cm transverse diameter at the sinuses of Valsalva, 3.1 cm sino-tubular junction, 5.3 cm proximal ascending, 4.6 cm distal ascending/ proximal arch, 3.2 cm distal arch, 3.8 cm proximal descending, with fusiform dilatation to 4.2 cm in the mid descending segment, tapering down to a 3.8 cm above the diaphragm, 3.3 cm in the supra celiac segment. No dissection or stenosis. There is bovine variant brachiocephalic arterial origin anatomy without proximal stenosis. 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  Moderate coronary calcifications.  No pleural or pericardial effusion. No hilar or mediastinal adenopathy. Lungs are clear. Bridging  osteophytes across multiple levels in the lower thoracic spine. Fixation hardware at in the cervical spine, incompletely visualized. Sternum intact. Stable benign low-attenuation lesion in hepatic segment 7, stable since 02/21/2011. Remainder of Visualized portions of upper abdomen unremarkable.  Review of the MIP images confirms the above findings.  IMPRESSION: 1. 5.3 cm ascending aortic aneurysm without complicating features (previously 5.2 cm).   Electronically Signed  By: Lucrezia Europe M.D.  On: 03/21/2015 14:04     Impression: 74 year old man with a 5.2-5.3 cm ascending aortic aneurysm. This may have grown by a millimeter over the past 6 months or may just be intraobserver variability. In any event the aneurysm is close to, but does not quite meet size criteria for repair.  I am concerned about his blood pressure being elevated today. It may just be from going to the scan and then coming back up to the office. I asked him to check his blood pressure twice a day and if he sees persistent readings in the 253 systolic range  he should call to have his blood pressure medications adjusted. He is on quite a high dose of metoprolol, but only on 25 mg of Cozaar daily.  I did encourage him to exercise on a regular basis. He says he has not been able to exercise because of the weather. I mentioned some alternatives for him to consider.  Plan: Return in 6 months with CT of chest  He knows to call for immediate medical assistance if he experiences chest pain or severe shortness of breath.  Melrose Nakayama, MD [facility] [phone]

## 2015-03-30 DIAGNOSIS — K5732 Diverticulitis of large intestine without perforation or abscess without bleeding: Secondary | ICD-10-CM | POA: Diagnosis not present

## 2015-03-30 DIAGNOSIS — K2270 Barrett's esophagus without dysplasia: Secondary | ICD-10-CM | POA: Diagnosis not present

## 2015-03-30 DIAGNOSIS — K219 Gastro-esophageal reflux disease without esophagitis: Secondary | ICD-10-CM | POA: Diagnosis not present

## 2015-03-31 DIAGNOSIS — J309 Allergic rhinitis, unspecified: Secondary | ICD-10-CM | POA: Diagnosis not present

## 2015-03-31 DIAGNOSIS — G4700 Insomnia, unspecified: Secondary | ICD-10-CM | POA: Diagnosis not present

## 2015-03-31 DIAGNOSIS — R5382 Chronic fatigue, unspecified: Secondary | ICD-10-CM | POA: Diagnosis not present

## 2015-03-31 DIAGNOSIS — K219 Gastro-esophageal reflux disease without esophagitis: Secondary | ICD-10-CM | POA: Diagnosis not present

## 2015-04-14 DIAGNOSIS — B9689 Other specified bacterial agents as the cause of diseases classified elsewhere: Secondary | ICD-10-CM | POA: Diagnosis not present

## 2015-04-14 DIAGNOSIS — J019 Acute sinusitis, unspecified: Secondary | ICD-10-CM | POA: Diagnosis not present

## 2015-04-25 ENCOUNTER — Other Ambulatory Visit: Payer: Self-pay

## 2015-04-25 DIAGNOSIS — Z9862 Peripheral vascular angioplasty status: Secondary | ICD-10-CM | POA: Diagnosis not present

## 2015-04-25 DIAGNOSIS — K449 Diaphragmatic hernia without obstruction or gangrene: Secondary | ICD-10-CM | POA: Diagnosis not present

## 2015-04-25 DIAGNOSIS — I10 Essential (primary) hypertension: Secondary | ICD-10-CM | POA: Diagnosis not present

## 2015-04-25 DIAGNOSIS — Z87891 Personal history of nicotine dependence: Secondary | ICD-10-CM | POA: Diagnosis not present

## 2015-04-25 DIAGNOSIS — Z8673 Personal history of transient ischemic attack (TIA), and cerebral infarction without residual deficits: Secondary | ICD-10-CM | POA: Diagnosis not present

## 2015-04-25 DIAGNOSIS — Z85118 Personal history of other malignant neoplasm of bronchus and lung: Secondary | ICD-10-CM | POA: Diagnosis not present

## 2015-04-25 DIAGNOSIS — J449 Chronic obstructive pulmonary disease, unspecified: Secondary | ICD-10-CM | POA: Diagnosis not present

## 2015-04-25 DIAGNOSIS — I2510 Atherosclerotic heart disease of native coronary artery without angina pectoris: Secondary | ICD-10-CM | POA: Diagnosis not present

## 2015-04-25 DIAGNOSIS — E119 Type 2 diabetes mellitus without complications: Secondary | ICD-10-CM | POA: Diagnosis not present

## 2015-04-25 DIAGNOSIS — I739 Peripheral vascular disease, unspecified: Secondary | ICD-10-CM | POA: Diagnosis not present

## 2015-04-25 DIAGNOSIS — K219 Gastro-esophageal reflux disease without esophagitis: Secondary | ICD-10-CM | POA: Diagnosis not present

## 2015-04-25 DIAGNOSIS — K2270 Barrett's esophagus without dysplasia: Secondary | ICD-10-CM | POA: Diagnosis not present

## 2015-04-30 DIAGNOSIS — R079 Chest pain, unspecified: Secondary | ICD-10-CM | POA: Diagnosis not present

## 2015-04-30 DIAGNOSIS — I10 Essential (primary) hypertension: Secondary | ICD-10-CM | POA: Diagnosis not present

## 2015-04-30 DIAGNOSIS — E780 Pure hypercholesterolemia: Secondary | ICD-10-CM | POA: Diagnosis not present

## 2015-04-30 DIAGNOSIS — J449 Chronic obstructive pulmonary disease, unspecified: Secondary | ICD-10-CM | POA: Diagnosis not present

## 2015-06-29 DIAGNOSIS — J309 Allergic rhinitis, unspecified: Secondary | ICD-10-CM | POA: Diagnosis not present

## 2015-06-29 DIAGNOSIS — J0100 Acute maxillary sinusitis, unspecified: Secondary | ICD-10-CM | POA: Diagnosis not present

## 2015-06-29 DIAGNOSIS — J209 Acute bronchitis, unspecified: Secondary | ICD-10-CM | POA: Diagnosis not present

## 2015-07-10 DIAGNOSIS — Z23 Encounter for immunization: Secondary | ICD-10-CM | POA: Diagnosis not present

## 2015-08-15 DIAGNOSIS — I701 Atherosclerosis of renal artery: Secondary | ICD-10-CM | POA: Diagnosis not present

## 2015-08-15 DIAGNOSIS — E782 Mixed hyperlipidemia: Secondary | ICD-10-CM | POA: Diagnosis not present

## 2015-08-15 DIAGNOSIS — I10 Essential (primary) hypertension: Secondary | ICD-10-CM | POA: Diagnosis not present

## 2015-08-15 DIAGNOSIS — Z79899 Other long term (current) drug therapy: Secondary | ICD-10-CM | POA: Diagnosis not present

## 2015-08-15 DIAGNOSIS — I2510 Atherosclerotic heart disease of native coronary artery without angina pectoris: Secondary | ICD-10-CM | POA: Diagnosis not present

## 2015-08-15 DIAGNOSIS — I252 Old myocardial infarction: Secondary | ICD-10-CM | POA: Diagnosis not present

## 2015-08-15 DIAGNOSIS — I739 Peripheral vascular disease, unspecified: Secondary | ICD-10-CM | POA: Diagnosis not present

## 2015-09-04 ENCOUNTER — Other Ambulatory Visit: Payer: Self-pay | Admitting: *Deleted

## 2015-09-04 DIAGNOSIS — I712 Thoracic aortic aneurysm, without rupture, unspecified: Secondary | ICD-10-CM

## 2015-09-11 DIAGNOSIS — K521 Toxic gastroenteritis and colitis: Secondary | ICD-10-CM | POA: Diagnosis not present

## 2015-09-22 DIAGNOSIS — I712 Thoracic aortic aneurysm, without rupture: Secondary | ICD-10-CM | POA: Diagnosis not present

## 2015-09-23 LAB — CREATININE, SERUM: Creat: 0.93 mg/dL (ref 0.70–1.18)

## 2015-09-24 DIAGNOSIS — J0100 Acute maxillary sinusitis, unspecified: Secondary | ICD-10-CM | POA: Diagnosis not present

## 2015-09-26 ENCOUNTER — Ambulatory Visit
Admission: RE | Admit: 2015-09-26 | Discharge: 2015-09-26 | Disposition: A | Discharge status: Home / Self Care | Payer: Medicare Other | Source: Ambulatory Visit | Attending: Thoracic Surgery (Cardiothoracic Vascular Surgery) | Admitting: Thoracic Surgery (Cardiothoracic Vascular Surgery)

## 2015-09-26 ENCOUNTER — Encounter: Payer: Self-pay | Admitting: Thoracic Surgery (Cardiothoracic Vascular Surgery)

## 2015-09-26 ENCOUNTER — Ambulatory Visit (INDEPENDENT_AMBULATORY_CARE_PROVIDER_SITE_OTHER): Payer: Medicare Other | Admitting: Thoracic Surgery (Cardiothoracic Vascular Surgery)

## 2015-09-26 VITALS — BP 117/69 | HR 72 | Resp 20 | Ht 67.0 in | Wt 162.0 lb

## 2015-09-26 DIAGNOSIS — I712 Thoracic aortic aneurysm, without rupture, unspecified: Secondary | ICD-10-CM

## 2015-09-26 DIAGNOSIS — I7121 Aneurysm of the ascending aorta, without rupture: Secondary | ICD-10-CM

## 2015-09-26 DIAGNOSIS — Z85118 Personal history of other malignant neoplasm of bronchus and lung: Secondary | ICD-10-CM

## 2015-09-26 MED ORDER — IOPAMIDOL (ISOVUE-370) INJECTION 76%
75.0000 mL | Freq: Once | INTRAVENOUS | Status: AC | PRN
  Administered 2015-09-26: 75 mL via INTRAVENOUS

## 2015-09-26 NOTE — Progress Notes (Signed)
Drew [name]       [place] [address]             [phone]     HPI: Drew [name] returns today for a scheduled 6 month follow-up visit.  He is a 74 year old gentleman with a history of COPD and lung cancer. He was being followed with routine CT scans for his lung cancer. In 2014 it was noted that he had a 5 cm ascending aortic aneurysm. In retrospect going back to 2011 he had the aneurysm at that time.  In June 2015 radiology noted the aneurysm had increased in size from 5.0 to 5.2 cm. I saw him again in December 2015, and it was unchanged. His most recent visit was in June of this year. At that time the official reading was 5.2-5.3 cm.  He complains of a head cold. He has had sinus congestion and sneezing. He has had difficulty sleeping. He has shortness of breath is unchanged. His weight is stable. He denies any chest pain, pressure, or tightness. He has a blood pressure cuff at home and says his blood pressure has been controlled.  Past Medical History  Diagnosis Date  . Cancer ([place]) 2011    lung  . COPD (chronic obstructive pulmonary disease) ([place])   . 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 Hypertension      Current Outpatient Prescriptions  Medication Sig Dispense Refill  . albuterol (PROVENTIL HFA;VENTOLIN HFA) 108 (90 BASE) MCG/ACT inhaler Inhale into the lungs every 6 (six) hours as needed for wheezing or shortness of breath.    Marland Kitchen aspirin 325 MG EC tablet Take 325 mg by mouth daily.    . budesonide-formoterol (SYMBICORT) 80-4.5 MCG/ACT inhaler Inhale 2 puffs into the lungs 2 (two) times daily.    . clopidogrel (PLAVIX) 75 MG tablet Take 75 mg by mouth daily.    . famotidine (PEPCID AC) 10 MG chewable tablet Chew 10 mg by mouth 2 (two) times daily.    . finasteride (PROSCAR) 5 MG tablet Take 5 mg by mouth daily.    Marland Kitchen losartan (COZAAR) 25 MG tablet Take 25 mg by mouth daily.    . metFORMIN (GLUCOPHAGE-XR) 500 MG 24 hr tablet  Take 500 mg by mouth daily with breakfast.    . metoprolol (TOPROL-XL) 200 MG 24 hr tablet Take 200 mg by mouth daily.    . pantoprazole (PROTONIX) 40 MG tablet Take 40 mg by mouth daily.    . simvastatin (ZOCOR) 20 MG tablet Take 10 mg by mouth at bedtime.     . tamsulosin (FLOMAX) 0.4 MG CAPS Take 0.4 mg by mouth daily.    . traZODone (DESYREL) 100 MG tablet Take 100 mg by mouth at bedtime.     No current facility-administered medications for this visit.    Physical Exam  Diagnostic Tests: CT ANGIOGRAPHY CHEST WITH CONTRAST  TECHNIQUE: Multidetector CT imaging of the chest was performed using the standard protocol during bolus administration of intravenous contrast. Multiplanar CT image reconstructions and MIPs were obtained to evaluate the vascular anatomy.  CONTRAST: 75 mL Isovue 370 IV  COMPARISON: 03/21/2015, a 09/27/2014 and multiple other prior studies.  FINDINGS: Stable aneurysmal dilatation of the ascending thoracic aorta measures 5.2 cm in greatest diameter. The aorta measures 3.9 cm at the level of the sinuses of Valsalva. The proximal arch measures 4.1 cm. The distal arch measures 3.5 cm. Maximal diameter of the descending thoracic aorta is  4.2 cm.  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 Fusiform dilatation of the innominate artery measures 2.1 cm in greatest diameter. The right subclavian artery measures 1.5 cm. No significant obstructive disease is identified involving the great vessels.  The aorta shows no evidence of dissection or leak. The heart size is stable. Stable calcified plaque noted in the distribution of the left main coronary artery and LAD.  There are stable postoperative changes related to prior left lung resection surgery. No pulmonary masses, infiltrates or edema identified. No pleural or pericardial fluid is seen. No lymphadenopathy identified. Stable  spondylosis of the thoracic spine.  Review of the MIP images confirms the above findings.  IMPRESSION: Stable aneurysmal disease involving the thoracic aorta with maximal diameter of 5.2 cm at the level of the ascending thoracic aorta. The arch and descending thoracic aorta are also aneurysmal. No evidence of dissection. 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   Electronically Signed  By: Aletta Edouard M.D.  On: 09/26/2015 13:38 I personally reviewed the CT chest and concur with the findings as noted above  Impression: Drew [name] is a 74 year old gentleman with a history of lung cancer, COPD and hypertension. He has a known 5.2 cm ascending thoracic aortic aneurysm. This is unchanged over the past 6 months.  There is no indication for surgery at this time. He does need continued close follow-up. We will continue the CT scans every 6 months.  He has no evidence of recurrence of his lung cancer. He now is 5 years out from resection (as of last May)  Plan: Return in 6 months CT angiogram of chest  Melrose Nakayama, MD [facility] [phone]

## 2015-09-29 DIAGNOSIS — R51 Headache: Secondary | ICD-10-CM | POA: Diagnosis not present

## 2015-10-06 DIAGNOSIS — J0100 Acute maxillary sinusitis, unspecified: Secondary | ICD-10-CM | POA: Diagnosis not present

## 2015-10-06 DIAGNOSIS — R51 Headache: Secondary | ICD-10-CM | POA: Diagnosis not present

## 2015-10-08 DIAGNOSIS — E871 Hypo-osmolality and hyponatremia: Secondary | ICD-10-CM | POA: Diagnosis not present

## 2015-10-08 DIAGNOSIS — R51 Headache: Secondary | ICD-10-CM | POA: Diagnosis not present

## 2015-10-08 DIAGNOSIS — I10 Essential (primary) hypertension: Secondary | ICD-10-CM | POA: Diagnosis not present

## 2015-10-11 DIAGNOSIS — I776 Arteritis, unspecified: Secondary | ICD-10-CM | POA: Diagnosis not present

## 2015-10-11 DIAGNOSIS — H532 Diplopia: Secondary | ICD-10-CM | POA: Diagnosis not present

## 2015-10-26 DIAGNOSIS — J329 Chronic sinusitis, unspecified: Secondary | ICD-10-CM | POA: Diagnosis not present

## 2015-10-26 DIAGNOSIS — R0981 Nasal congestion: Secondary | ICD-10-CM | POA: Diagnosis not present

## 2015-10-26 DIAGNOSIS — J310 Chronic rhinitis: Secondary | ICD-10-CM | POA: Diagnosis not present

## 2015-10-26 DIAGNOSIS — R51 Headache: Secondary | ICD-10-CM | POA: Diagnosis not present

## 2015-10-26 DIAGNOSIS — J342 Deviated nasal septum: Secondary | ICD-10-CM | POA: Diagnosis not present

## 2015-11-02 DIAGNOSIS — J343 Hypertrophy of nasal turbinates: Secondary | ICD-10-CM | POA: Diagnosis not present

## 2015-11-02 DIAGNOSIS — J342 Deviated nasal septum: Secondary | ICD-10-CM | POA: Diagnosis not present

## 2015-11-02 DIAGNOSIS — J341 Cyst and mucocele of nose and nasal sinus: Secondary | ICD-10-CM | POA: Diagnosis not present

## 2015-11-02 DIAGNOSIS — R0981 Nasal congestion: Secondary | ICD-10-CM | POA: Diagnosis not present

## 2015-11-02 IMAGING — CT CT ANGIO CHEST
2 of 5 series · 9 of 30 positions shown · IV contrast (75CC OMNI 350)
Comparison: 04/12/2014

CLINICAL DATA: Aortic aneurysm

EXAM:
CT ANGIOGRAPHY CHEST WITH CONTRAST
TECHNIQUE: Multidetector CT imaging of the chest was performed using the
standard protocol during bolus administration of intravenous
contrast. Multiplanar CT image reconstructions and MIPs were
obtained to evaluate the vascular anatomy.
CONTRAST:  75mL OMNIPAQUE IOHEXOL 350 MG/ML SOLN

[Series 4: angio · axial · 0.70mm/px · z∈[-232,-54]mm · 4 of 119 slices shown]
[im 24/119  lung]
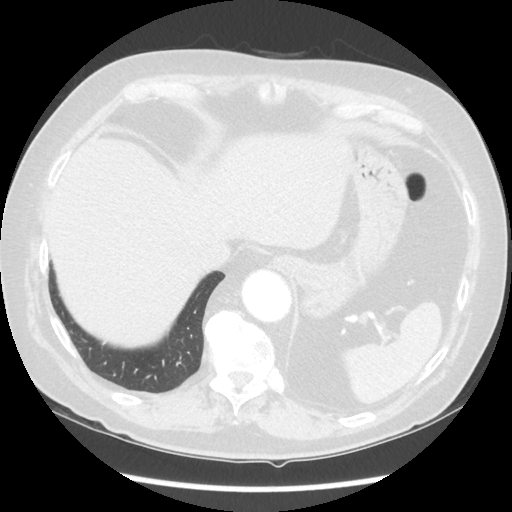
[im 48/119  mediastinal]
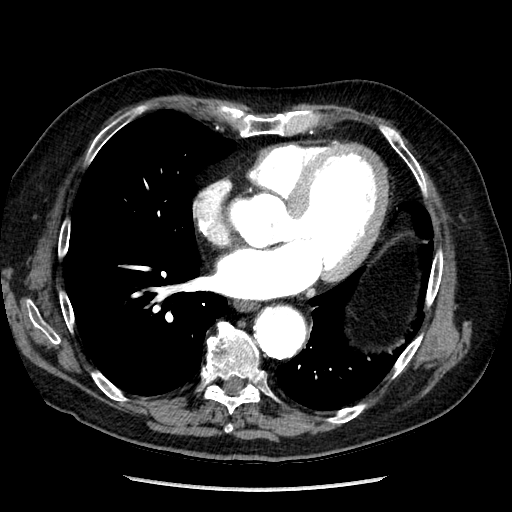
[im 71/119  lung]
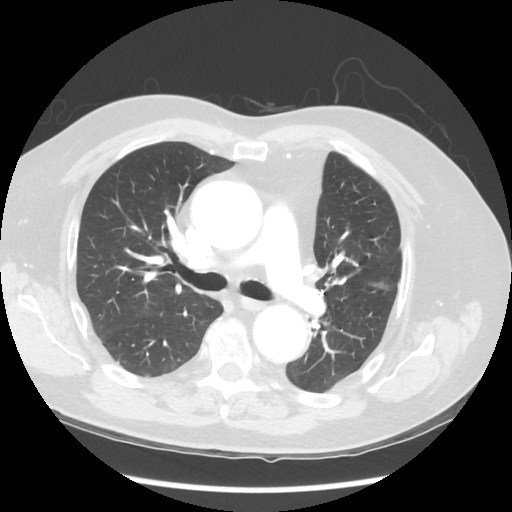
[im 95/119  mediastinal]
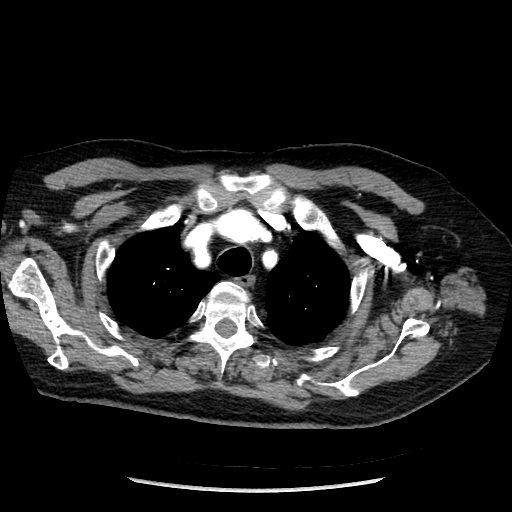

[Series 602: sagittal body · sagittal · 0.70mm/px · 5 of 145 slices shown]
[im 25/145  lung]
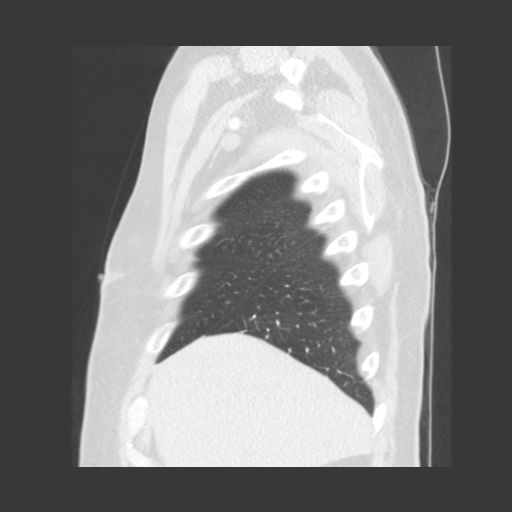
[im 49/145  lung]
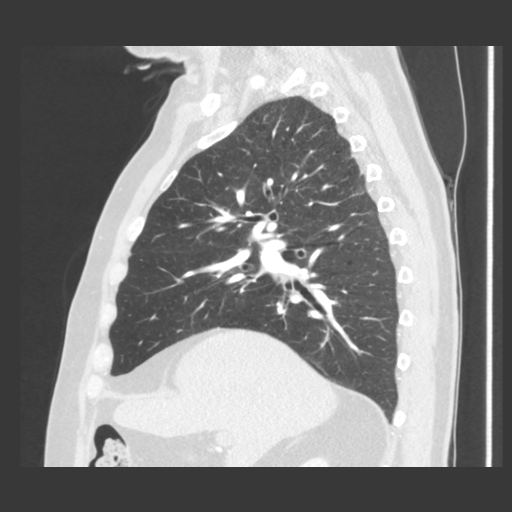
[im 73/145  lung]
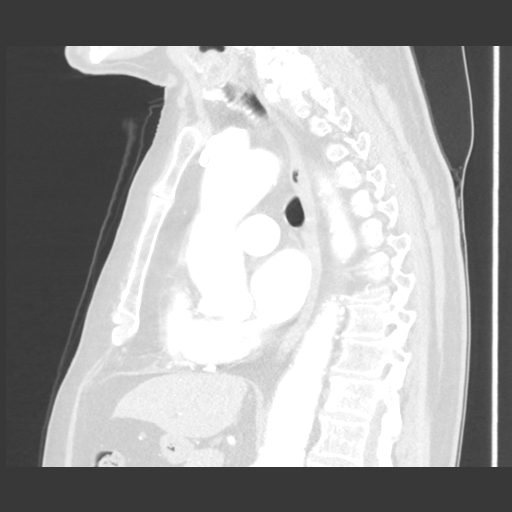
[im 97/145  lung]
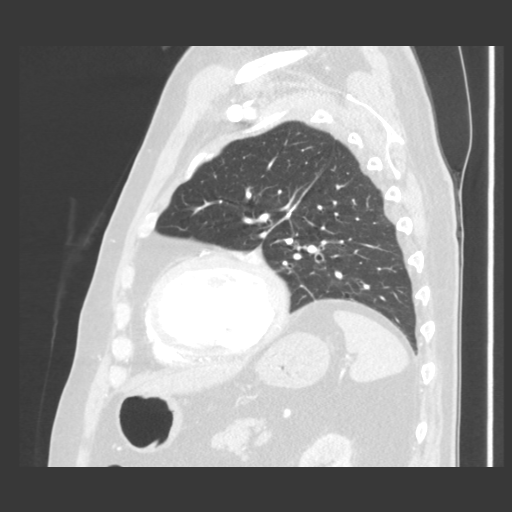
[im 121/145  lung]
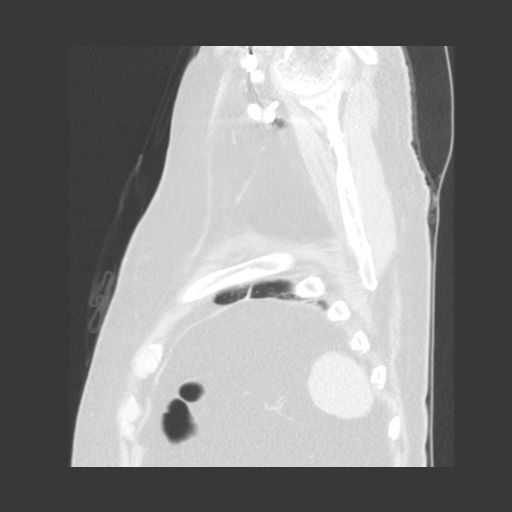

[9 of 30 positions shown; findings below may reference images not displayed]

FINDINGS: Maximal diameters of the ascending aorta at the sinus of Valsalva,
Jhon Robert junction, and ascending aorta are 4.1 cm, 3.0 cm, and
5.2 cm. Previous maximal diameter was 5.2 cm there is no evidence of
dissection or obvious intramural hematoma. Mixing artifact in the
descending aorta is noted, within the nondependent portion of the
blood column.

Innominate artery dilatation measures 2.3 cm. This is stable based
on my direct measurements. Innominate artery is patent. Mild
narrowing at the origin of the right subclavian artery. Right
vertebral artery is patent and very diminutive. Right common
carotid, left common carotid via bovine origin, and left subclavian
artery are patent. Atherosclerotic change at the origin of the left
vertebral artery. It is patent with tortuosity.

There is no obvious filling defect in the pulmonary arterial tree to
suggest acute pulmonary thromboembolism.

No abnormal mediastinal adenopathy. Three vessel coronary artery
calcifications. Left main coronary artery calcification.

No pneumothorax or pleural effusion.

4 mm left lower lobe pulmonary nodule is stable dating back to 2438.

No acute bony deformity. Posterior decompression and anterior fusion
in the lower cervical spine has been performed.

Stable sub cm liver lesion.

Review of the MIP images confirms the above findings.
IMPRESSION: Ascending thoracic aortic aneurysm with a maximal diameter of 5.2 cm
is stable and without evidence of dissection.

## 2015-11-08 DIAGNOSIS — R5383 Other fatigue: Secondary | ICD-10-CM | POA: Diagnosis not present

## 2015-11-08 DIAGNOSIS — N400 Enlarged prostate without lower urinary tract symptoms: Secondary | ICD-10-CM | POA: Diagnosis not present

## 2015-11-08 DIAGNOSIS — D539 Nutritional anemia, unspecified: Secondary | ICD-10-CM | POA: Diagnosis not present

## 2015-11-08 DIAGNOSIS — R7302 Impaired glucose tolerance (oral): Secondary | ICD-10-CM | POA: Diagnosis not present

## 2016-01-02 DIAGNOSIS — J069 Acute upper respiratory infection, unspecified: Secondary | ICD-10-CM | POA: Diagnosis not present

## 2016-01-22 DIAGNOSIS — J209 Acute bronchitis, unspecified: Secondary | ICD-10-CM | POA: Diagnosis not present

## 2016-01-22 DIAGNOSIS — J309 Allergic rhinitis, unspecified: Secondary | ICD-10-CM | POA: Diagnosis not present

## 2016-01-22 DIAGNOSIS — H1033 Unspecified acute conjunctivitis, bilateral: Secondary | ICD-10-CM | POA: Diagnosis not present

## 2016-01-30 DIAGNOSIS — J209 Acute bronchitis, unspecified: Secondary | ICD-10-CM | POA: Diagnosis not present

## 2016-01-30 DIAGNOSIS — J302 Other seasonal allergic rhinitis: Secondary | ICD-10-CM | POA: Diagnosis not present

## 2016-02-07 DIAGNOSIS — Z0000 Encounter for general adult medical examination without abnormal findings: Secondary | ICD-10-CM | POA: Diagnosis not present

## 2016-02-07 DIAGNOSIS — I10 Essential (primary) hypertension: Secondary | ICD-10-CM | POA: Diagnosis not present

## 2016-02-07 DIAGNOSIS — Z9181 History of falling: Secondary | ICD-10-CM | POA: Diagnosis not present

## 2016-02-07 DIAGNOSIS — E1165 Type 2 diabetes mellitus with hyperglycemia: Secondary | ICD-10-CM | POA: Diagnosis not present

## 2016-02-07 DIAGNOSIS — Z1389 Encounter for screening for other disorder: Secondary | ICD-10-CM | POA: Diagnosis not present

## 2016-03-13 ENCOUNTER — Other Ambulatory Visit: Payer: Self-pay | Admitting: Thoracic Surgery (Cardiothoracic Vascular Surgery)

## 2016-03-13 DIAGNOSIS — I712 Thoracic aortic aneurysm, without rupture, unspecified: Secondary | ICD-10-CM

## 2016-04-09 ENCOUNTER — Encounter: Payer: Self-pay | Admitting: Thoracic Surgery (Cardiothoracic Vascular Surgery)

## 2016-04-09 ENCOUNTER — Ambulatory Visit (INDEPENDENT_AMBULATORY_CARE_PROVIDER_SITE_OTHER): Payer: Medicare Other | Admitting: Thoracic Surgery (Cardiothoracic Vascular Surgery)

## 2016-04-09 ENCOUNTER — Ambulatory Visit
Admission: RE | Admit: 2016-04-09 | Discharge: 2016-04-09 | Disposition: A | Discharge status: Home / Self Care | Payer: Medicare Other | Source: Ambulatory Visit | Attending: Thoracic Surgery (Cardiothoracic Vascular Surgery) | Admitting: Thoracic Surgery (Cardiothoracic Vascular Surgery)

## 2016-04-09 VITALS — BP 122/70 | HR 53 | Resp 16 | Ht 67.0 in | Wt 162.0 lb

## 2016-04-09 DIAGNOSIS — I712 Thoracic aortic aneurysm, without rupture, unspecified: Secondary | ICD-10-CM

## 2016-04-09 DIAGNOSIS — I7121 Aneurysm of the ascending aorta, without rupture: Secondary | ICD-10-CM

## 2016-04-09 DIAGNOSIS — Z85118 Personal history of other malignant neoplasm of bronchus and lung: Secondary | ICD-10-CM

## 2016-04-09 NOTE — Progress Notes (Signed)
[address]       [place] [address]             [phone]       HPI: [name] returns today for a scheduled 6 month follow-up visit.  He is a 75 year old man with a history of COPD, prior lingular resection for lung cancer, hypertension, and a 5.2 cm ascending aneurysm.   He was being followed with routine CT scans for lung cancer. In 2014 it was noted that he had a 5 cm ascending aortic aneurysm. In retrospect the aneurysm was present in 2011.  In June 2015 radiology noted the aneurysm had increased in size from 5.0 to 5.2 cm. the aneurysm was stable at 5.2 cm at his most recent visit in December 2016.  He has been feeling well. He has lost some weight as he has stopped eating bread. He denies any chest pain, pressure, or tightness. His COPD is unchanged and he has not had any recent exacerbations.  Past Medical History  Diagnosis Date  . Cancer ([place]) 2011    lung  . COPD (chronic obstructive pulmonary disease) ([place])   . 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 Hypertension     Current Outpatient Prescriptions  Medication Sig Dispense Refill  . albuterol (PROVENTIL HFA;VENTOLIN HFA) 108 (90 BASE) MCG/ACT inhaler Inhale into the lungs every 6 (six) hours as needed for wheezing or shortness of breath.    Marland Kitchen aspirin 81 MG tablet Take 81 mg by mouth daily.    . budesonide-formoterol (SYMBICORT) 80-4.5 MCG/ACT inhaler Inhale 2 puffs into the lungs 2 (two) times daily.    . clopidogrel (PLAVIX) 75 MG tablet Take 75 mg by mouth daily.    . famotidine (PEPCID AC) 10 MG chewable tablet Chew 10 mg by mouth 2 (two) times daily.    . finasteride (PROSCAR) 5 MG tablet Take 5 mg by mouth daily.    Marland Kitchen losartan (COZAAR) 25 MG tablet Take 25 mg by mouth daily.    . metFORMIN (GLUCOPHAGE-XR) 500 MG 24 hr tablet Take 500 mg by mouth daily with breakfast.    . metoprolol (TOPROL-XL) 200 MG 24 hr tablet Take 200 mg by mouth daily.    . pantoprazole  (PROTONIX) 40 MG tablet Take 40 mg by mouth daily.    . simvastatin (ZOCOR) 5 MG tablet Take 5 mg by mouth daily.    . tamsulosin (FLOMAX) 0.4 MG CAPS Take 0.4 mg by mouth daily.    . traZODone (DESYREL) 100 MG tablet Take 100 mg by mouth at bedtime.     No current facility-administered medications for this visit.    Physical Exam BP 122/70 mmHg  Pulse 53  Resp 16  Ht '5\' 7"'$  (1.702 m)  Wt 162 lb (73.483 kg)  BMI 25.37 kg/m2  SpO32 46% 75 year old man in no acute distress Alert and oriented 3 with no focal deficits No carotid bruits No cervical or subclavicular adenopathy Cardiac regular rate and rhythm normal S1 and S2 Lungs with diminished breath sounds bilaterally, no rales or wheezing  Diagnostic Tests: CT CHEST WITHOUT CONTRAST  TECHNIQUE: Multidetector CT imaging of the chest was performed following the standard protocol without IV contrast.  COMPARISON: CT scan of September 26, 2015.  FINDINGS: Cardiovascular: Atherosclerosis of thoracic aorta is noted. Due to the lack of intravenous contrast, dissection cannot be evaluated for or excluded. Aneurysmal dilatation of the ascending thoracic aorta is again noted and  stable, measuring 5.2 cm in maximum diameter. Proximal portion of transverse aortic arch measures 4.2 cm which is not significantly changed. Distal descending thoracic aorta measures 4.3 cm which is not significantly changed. Extensive coronary artery calcifications are noted.  Proximal portion of the proximal portion of right common carotid artery measures 1.7 cm which is not significantly changed. Innominate artery measures 2.7 cm which is not significantly changed compared to prior exam.  Mediastinum/Nodes: No significant mediastinal mass or adenopathy is noted.  Lungs/Pleura: No pneumothorax or pleural effusion is noted. No acute pulmonary disease is noted. Stable scarring is noted in left upper lobe and left lung base.  Upper Abdomen:  Stable low density is noted in right hepatic lobe most consistent with cyst. Status post stent graft repair of visualized portion of proximal abdominal aorta.  Musculoskeletal: Anterior osteophyte formation is noted in the middle and lower thoracic spine.  IMPRESSION: 5.2 cm ascending thoracic aortic aneurysm is stable compared to prior exam. Aneurysmal dilatation of the aortic arch and descending thoracic aorta is also noted. Ascending thoracic aortic aneurysm. Recommend semi-annual imaging followup by CTA or MRA and referral to cardiothoracic surgery if not already obtained. This recommendation follows 2010 ACCF/AHA/AATS/ACR/ASA/SCA/SCAI/SIR/STS/SVM Guidelines for the Diagnosis and Management of Patients With Thoracic Aortic Disease. Circulation. 2010; 121: P366-K159.  The possibility of dissection cannot be evaluated or excluded due to the lack of intravenous contrast.  Extensive coronary calcifications are noted suggesting coronary artery disease.   Electronically Signed  By: Marijo Conception, M.D.  On: 04/09/2016 09:58  I personally reviewed the CT chest and concur with the findings as noted above.  Impression: 75 year old man with history of lung cancer resected in 2011 and a 5.2 cm ascending aneurysm.  Lung cancer - lingular segmentectomy in 2011. No evidence of recurrent disease.  Ascending aneurysm - stable at 5.2 cm. Interval growth of 5 mm or size in the 5.5- 6 cm range would be indications for surgery.  He understands the importance of blood pressure control  Hypertension - blood pressure well controlled on current regimen   COPD - followed by Dr. Melvyn Novas   Plan:  CT angio in 6 months  Melrose Nakayama, MD [facility] [phone]

## 2016-04-15 DIAGNOSIS — K2270 Barrett's esophagus without dysplasia: Secondary | ICD-10-CM | POA: Diagnosis not present

## 2016-04-15 DIAGNOSIS — K5732 Diverticulitis of large intestine without perforation or abscess without bleeding: Secondary | ICD-10-CM | POA: Diagnosis not present

## 2016-04-15 DIAGNOSIS — K219 Gastro-esophageal reflux disease without esophagitis: Secondary | ICD-10-CM | POA: Diagnosis not present

## 2016-04-18 DIAGNOSIS — I701 Atherosclerosis of renal artery: Secondary | ICD-10-CM | POA: Diagnosis not present

## 2016-04-18 DIAGNOSIS — Z79899 Other long term (current) drug therapy: Secondary | ICD-10-CM | POA: Diagnosis not present

## 2016-04-18 DIAGNOSIS — I2510 Atherosclerotic heart disease of native coronary artery without angina pectoris: Secondary | ICD-10-CM | POA: Diagnosis not present

## 2016-04-18 DIAGNOSIS — E782 Mixed hyperlipidemia: Secondary | ICD-10-CM | POA: Diagnosis not present

## 2016-04-18 DIAGNOSIS — I739 Peripheral vascular disease, unspecified: Secondary | ICD-10-CM | POA: Diagnosis not present

## 2016-04-18 DIAGNOSIS — I10 Essential (primary) hypertension: Secondary | ICD-10-CM | POA: Diagnosis not present

## 2016-05-08 DIAGNOSIS — E119 Type 2 diabetes mellitus without complications: Secondary | ICD-10-CM | POA: Diagnosis not present

## 2016-05-08 DIAGNOSIS — Z9181 History of falling: Secondary | ICD-10-CM | POA: Diagnosis not present

## 2016-05-08 DIAGNOSIS — Z1389 Encounter for screening for other disorder: Secondary | ICD-10-CM | POA: Diagnosis not present

## 2016-06-03 DIAGNOSIS — J209 Acute bronchitis, unspecified: Secondary | ICD-10-CM | POA: Diagnosis not present

## 2016-06-03 DIAGNOSIS — J0100 Acute maxillary sinusitis, unspecified: Secondary | ICD-10-CM | POA: Diagnosis not present

## 2016-07-07 DIAGNOSIS — J324 Chronic pansinusitis: Secondary | ICD-10-CM | POA: Diagnosis not present

## 2016-07-19 DIAGNOSIS — Z23 Encounter for immunization: Secondary | ICD-10-CM | POA: Diagnosis not present

## 2016-09-03 ENCOUNTER — Other Ambulatory Visit: Payer: Self-pay | Admitting: Thoracic Surgery (Cardiothoracic Vascular Surgery)

## 2016-09-03 DIAGNOSIS — I712 Thoracic aortic aneurysm, without rupture: Secondary | ICD-10-CM

## 2016-09-03 DIAGNOSIS — I7121 Aneurysm of the ascending aorta, without rupture: Secondary | ICD-10-CM

## 2016-09-24 ENCOUNTER — Ambulatory Visit: Payer: Medicare Other | Admitting: Thoracic Surgery (Cardiothoracic Vascular Surgery)

## 2016-09-24 ENCOUNTER — Inpatient Hospital Stay: Admission: RE | Admit: 2016-09-24 | Payer: Medicare Other | Source: Ambulatory Visit

## 2016-10-15 ENCOUNTER — Ambulatory Visit (INDEPENDENT_AMBULATORY_CARE_PROVIDER_SITE_OTHER): Payer: Medicare Other | Admitting: Thoracic Surgery (Cardiothoracic Vascular Surgery)

## 2016-10-15 ENCOUNTER — Ambulatory Visit
Admission: RE | Admit: 2016-10-15 | Discharge: 2016-10-15 | Disposition: A | Discharge status: Home / Self Care | Payer: Medicare Other | Source: Ambulatory Visit | Attending: Thoracic Surgery (Cardiothoracic Vascular Surgery) | Admitting: Thoracic Surgery (Cardiothoracic Vascular Surgery)

## 2016-10-15 ENCOUNTER — Encounter: Payer: Self-pay | Admitting: Thoracic Surgery (Cardiothoracic Vascular Surgery)

## 2016-10-15 ENCOUNTER — Ambulatory Visit: Payer: Medicare Other | Admitting: Thoracic Surgery (Cardiothoracic Vascular Surgery)

## 2016-10-15 ENCOUNTER — Other Ambulatory Visit: Payer: Medicare Other

## 2016-10-15 VITALS — BP 141/67 | HR 48 | Resp 93 | Ht 67.0 in | Wt 162.0 lb

## 2016-10-15 DIAGNOSIS — I701 Atherosclerosis of renal artery: Secondary | ICD-10-CM

## 2016-10-15 DIAGNOSIS — I712 Thoracic aortic aneurysm, without rupture, unspecified: Secondary | ICD-10-CM

## 2016-10-15 DIAGNOSIS — J45909 Unspecified asthma, uncomplicated: Secondary | ICD-10-CM

## 2016-10-15 DIAGNOSIS — I739 Peripheral vascular disease, unspecified: Secondary | ICD-10-CM | POA: Insufficient documentation

## 2016-10-15 DIAGNOSIS — I2510 Atherosclerotic heart disease of native coronary artery without angina pectoris: Secondary | ICD-10-CM | POA: Insufficient documentation

## 2016-10-15 DIAGNOSIS — I7121 Aneurysm of the ascending aorta, without rupture: Secondary | ICD-10-CM

## 2016-10-15 DIAGNOSIS — E7800 Pure hypercholesterolemia, unspecified: Secondary | ICD-10-CM

## 2016-10-15 DIAGNOSIS — I10 Essential (primary) hypertension: Secondary | ICD-10-CM | POA: Insufficient documentation

## 2016-10-15 HISTORY — DX: Atherosclerotic heart disease of native coronary artery without angina pectoris: I25.10

## 2016-10-15 HISTORY — DX: Pure hypercholesterolemia, unspecified: E78.00

## 2016-10-15 HISTORY — DX: Unspecified asthma, uncomplicated: J45.909

## 2016-10-15 HISTORY — DX: Atherosclerosis of renal artery: I70.1

## 2016-10-15 MED ORDER — IOPAMIDOL (ISOVUE-370) INJECTION 76%
75.0000 mL | Freq: Once | INTRAVENOUS | Status: AC | PRN
  Administered 2016-10-15: 75 mL via INTRAVENOUS

## 2016-10-15 NOTE — Progress Notes (Signed)
[address]       [place], [address]             [phone]    HPI: Drew [name] returns for follow-up of his ascending aneurysm  He is a 76 year old man with a history of COPD, prior lingular resection for lung cancer, hypertension, and a 5.2 cm ascending aneurysm.   He was being followed with routine CT scans for lung cancer. In 2014 it was noted that he had a 5 cm ascending aortic aneurysm. In retrospect, the aneurysm was present in 2011.  In June 2015 radiology noted the aneurysm had increased in size from 5.0 to 5.2 cm. the aneurysm was stable at 5.2 cm at the two most recent visits.  Interval history- he has been feeling well. He denies any chest pain, pressure, or tightness. He has COPD but has not had any recent exacerbations. He checks his blood pressure at home and says his systolic pressure has been less than 120 most of the time. He has not had any orthopnea, paroxysmal nocturnal dyspnea or peripheral edema.  Patient Active Problem List   Diagnosis Date Noted  . Asthma 10/15/2016  . Coronary artery disease 10/15/2016  . Hyperlipemia 10/15/2016  . Peripheral vascular disease ([place]) 10/15/2016  . Renal artery stenosis ([place]) 10/15/2016  . Dyspnea 03/01/2015  . COPD mixed type ([place]) 01/27/2015  . Cancer ([place])   . Thoracic aortic aneurysm without mention of rupture   . Hypertension     Current Outpatient Prescriptions  Medication Sig Dispense Refill  . albuterol (PROVENTIL HFA;VENTOLIN HFA) 108 (90 BASE) MCG/ACT inhaler Inhale into the lungs every 6 (six) hours as needed for wheezing or shortness of breath.    Marland Kitchen aspirin 81 MG tablet Take 81 mg by mouth daily.    . budesonide-formoterol (SYMBICORT) 80-4.5 MCG/ACT inhaler Inhale 2 puffs into the lungs 2 (two) times daily.    . clopidogrel (PLAVIX) 75 MG tablet Take 75 mg by mouth daily.    . famotidine (PEPCID AC) 10 MG chewable tablet Chew 10 mg by mouth 2 (two) times daily.    . finasteride  (PROSCAR) 5 MG tablet Take 5 mg by mouth daily.    Marland Kitchen losartan (COZAAR) 25 MG tablet Take 25 mg by mouth daily.    . metFORMIN (GLUCOPHAGE-XR) 500 MG 24 hr tablet Take 500 mg by mouth daily with breakfast.    . metoprolol (TOPROL-XL) 200 MG 24 hr tablet Take 200 mg by mouth daily.    . pantoprazole (PROTONIX) 40 MG tablet Take 40 mg by mouth daily.    . simvastatin (ZOCOR) 5 MG tablet Take 5 mg by mouth daily.    . tamsulosin (FLOMAX) 0.4 MG CAPS Take 0.4 mg by mouth daily.    . traZODone (DESYREL) 100 MG tablet Take 100 mg by mouth at bedtime.    . triamcinolone ointment (KENALOG) 0.1 % 0.1 application.    . nitroGLYCERIN (NITROSTAT) 0.4 MG SL tablet Place 0.4 mg under the tongue.     No current facility-administered medications for this visit.     Physical Exam BP (!) 141/67 (BP Location: Left Arm, Cuff Size: Normal)   Pulse (!) 48   Resp (!) 93 Comment: on RA  Ht '5\' 7"'$  (1.702 m)   Wt 162 lb (73.5 kg)   BMI 25.31 kg/m  76 year old man in no acute distress Alert and oriented 3 with no focal deficits No cervical or supraclavicular adenopathy, no carotid  bruits Lungs clear bilaterally Cardiac regular rate and rhythm normal S1 and S2, no audible murmur   Diagnostic Tests: CT ANGIOGRAPHY CHEST WITH CONTRAST  TECHNIQUE: Multidetector CT imaging of the chest was performed using the standard protocol during bolus administration of intravenous contrast. Multiplanar CT image reconstructions and MIPs were obtained to evaluate the vascular anatomy.  Creatinine was obtained on site at [facility] at [address]  Results: Creatinine 0.8 mg/dL.  GFR 88.  CONTRAST:  75 mL of Isovue 370 intravenous contrast  COMPARISON:  04/09/2016  FINDINGS: Cardiovascular: Thoracic aorta is diffusely dilated. This is most evident along the ascending aorta where it measures 5.2 cm. Descending aorta has a maximum diameter of 4.3 cm. These findings are stable.  No aortic  dissection. Mild atherosclerotic calcifications are noted along the aortic arch and descending thoracic aorta. The innominate artery and left common carotid artery have a common origin from the arch. This artery is dilated to 2.6 cm, also stable. There is mild atherosclerotic calcifications along the origin of the innominate artery and left subclavian artery.  Heart is normal in size. There are dense coronary artery calcifications.  Mediastinum/Nodes: No enlarged mediastinal, hilar, or axillary lymph nodes. Thyroid gland, trachea, and esophagus demonstrate no significant findings.  Lungs/Pleura: No pneumonia or pulmonary edema. Small peripheral nodular opacities in the right upper and middle lobe are stable. There is stable postsurgical change in scarring in the left lower lobe and base of the left upper lobe lingula. No pleural effusion. No pneumothorax.  Upper Abdomen: Stable subcentimeter liver lesion, likely a cyst. Bilateral renal cortical thinning. The upper aspect of an aortic stent is visualized. Left renal artery stents partly visualized, both stable. No acute findings.  Musculoskeletal: No fracture or acute finding. No osteoblastic or osteolytic lesions.  Review of the MIP images confirms the above findings.  IMPRESSION: 1. Stable thoracic aortic aneurysm measuring 5.2 cm along the ascending portion and 4.3 cm along the descending portion. No dissection. Recommend semi-annual imaging followup by CTA or MRA and referral to cardiothoracic surgery if not already obtained. This recommendation follows 2010 ACCF/AHA/AATS/ACR/ASA/SCA/SCAI/SIR/STS/SVM Guidelines for the Diagnosis and Management of Patients With Thoracic Aortic Disease. Circulation. 2010; 121: e266-e36 2. No acute findings. Stable atherosclerotic changes along the aorta and its branch vessels. Stable coronary artery calcifications. 3. Stable changes from previous left lung  surgery.   Electronically Signed   By: Lajean Manes M.D.   On: 10/15/2016 14:11 I personally reviewed the CT chest and concur with the findings noted above  Impression: Drew [name] is a 76 year old gentleman with a known 5.2 cm ascending aorta. He has a diffusely enlarged aorta throughout his descending aorta is about 4.3 cm. This is been unchanged over the past couple of years. Continued follow-up at six-month intervals.  Hypertension-his blood pressure was elevated today at 979 systolic. He says that he was stressed by multiple attempts to obtain IV access for his CT scan. He has a pressure cuff at home and I recommended he check that on a regular basis. If he has persistent systolic pressures greater than 130 then he needs to have his medications adjusted.  Plan: Return in 6 months with CT of chest  Melrose Nakayama, MD [facility] [phone]

## 2016-10-28 DIAGNOSIS — I701 Atherosclerosis of renal artery: Secondary | ICD-10-CM | POA: Diagnosis not present

## 2016-10-28 DIAGNOSIS — E782 Mixed hyperlipidemia: Secondary | ICD-10-CM | POA: Diagnosis not present

## 2016-10-28 DIAGNOSIS — I10 Essential (primary) hypertension: Secondary | ICD-10-CM | POA: Diagnosis not present

## 2016-10-28 DIAGNOSIS — Z79899 Other long term (current) drug therapy: Secondary | ICD-10-CM | POA: Diagnosis not present

## 2016-10-28 DIAGNOSIS — I739 Peripheral vascular disease, unspecified: Secondary | ICD-10-CM | POA: Diagnosis not present

## 2016-10-28 DIAGNOSIS — I2510 Atherosclerotic heart disease of native coronary artery without angina pectoris: Secondary | ICD-10-CM | POA: Diagnosis not present

## 2016-11-04 DIAGNOSIS — I739 Peripheral vascular disease, unspecified: Secondary | ICD-10-CM | POA: Diagnosis not present

## 2016-11-04 DIAGNOSIS — Z79899 Other long term (current) drug therapy: Secondary | ICD-10-CM | POA: Diagnosis not present

## 2016-11-04 DIAGNOSIS — E782 Mixed hyperlipidemia: Secondary | ICD-10-CM | POA: Diagnosis not present

## 2016-11-04 DIAGNOSIS — I701 Atherosclerosis of renal artery: Secondary | ICD-10-CM | POA: Diagnosis not present

## 2016-11-04 DIAGNOSIS — I10 Essential (primary) hypertension: Secondary | ICD-10-CM | POA: Diagnosis not present

## 2016-11-04 DIAGNOSIS — I2510 Atherosclerotic heart disease of native coronary artery without angina pectoris: Secondary | ICD-10-CM | POA: Diagnosis not present

## 2016-11-07 DIAGNOSIS — J111 Influenza due to unidentified influenza virus with other respiratory manifestations: Secondary | ICD-10-CM | POA: Diagnosis not present

## 2016-11-12 DIAGNOSIS — Z1389 Encounter for screening for other disorder: Secondary | ICD-10-CM | POA: Diagnosis not present

## 2016-11-12 DIAGNOSIS — E1143 Type 2 diabetes mellitus with diabetic autonomic (poly)neuropathy: Secondary | ICD-10-CM | POA: Diagnosis not present

## 2016-11-12 DIAGNOSIS — E785 Hyperlipidemia, unspecified: Secondary | ICD-10-CM | POA: Diagnosis not present

## 2016-11-12 DIAGNOSIS — J40 Bronchitis, not specified as acute or chronic: Secondary | ICD-10-CM | POA: Diagnosis not present

## 2016-11-12 DIAGNOSIS — D519 Vitamin B12 deficiency anemia, unspecified: Secondary | ICD-10-CM | POA: Diagnosis not present

## 2016-11-12 DIAGNOSIS — R5381 Other malaise: Secondary | ICD-10-CM | POA: Diagnosis not present

## 2016-11-12 DIAGNOSIS — R5383 Other fatigue: Secondary | ICD-10-CM | POA: Diagnosis not present

## 2017-01-16 DIAGNOSIS — Z1389 Encounter for screening for other disorder: Secondary | ICD-10-CM | POA: Diagnosis not present

## 2017-01-16 DIAGNOSIS — M5432 Sciatica, left side: Secondary | ICD-10-CM | POA: Diagnosis not present

## 2017-01-16 DIAGNOSIS — Z9181 History of falling: Secondary | ICD-10-CM | POA: Diagnosis not present

## 2017-01-16 DIAGNOSIS — R079 Chest pain, unspecified: Secondary | ICD-10-CM | POA: Diagnosis not present

## 2017-01-27 DIAGNOSIS — M545 Low back pain: Secondary | ICD-10-CM | POA: Diagnosis not present

## 2017-01-27 DIAGNOSIS — R2689 Other abnormalities of gait and mobility: Secondary | ICD-10-CM | POA: Diagnosis not present

## 2017-01-27 DIAGNOSIS — M6281 Muscle weakness (generalized): Secondary | ICD-10-CM | POA: Diagnosis not present

## 2017-02-03 DIAGNOSIS — M6281 Muscle weakness (generalized): Secondary | ICD-10-CM | POA: Diagnosis not present

## 2017-02-03 DIAGNOSIS — R2689 Other abnormalities of gait and mobility: Secondary | ICD-10-CM | POA: Diagnosis not present

## 2017-02-03 DIAGNOSIS — M545 Low back pain: Secondary | ICD-10-CM | POA: Diagnosis not present

## 2017-02-05 DIAGNOSIS — I739 Peripheral vascular disease, unspecified: Secondary | ICD-10-CM | POA: Diagnosis not present

## 2017-02-05 DIAGNOSIS — I2510 Atherosclerotic heart disease of native coronary artery without angina pectoris: Secondary | ICD-10-CM | POA: Diagnosis not present

## 2017-02-05 DIAGNOSIS — Z79899 Other long term (current) drug therapy: Secondary | ICD-10-CM | POA: Diagnosis not present

## 2017-02-05 DIAGNOSIS — I10 Essential (primary) hypertension: Secondary | ICD-10-CM | POA: Diagnosis not present

## 2017-02-05 DIAGNOSIS — I701 Atherosclerosis of renal artery: Secondary | ICD-10-CM | POA: Diagnosis not present

## 2017-02-05 DIAGNOSIS — J45909 Unspecified asthma, uncomplicated: Secondary | ICD-10-CM | POA: Diagnosis not present

## 2017-02-05 DIAGNOSIS — E782 Mixed hyperlipidemia: Secondary | ICD-10-CM | POA: Diagnosis not present

## 2017-02-06 DIAGNOSIS — R2689 Other abnormalities of gait and mobility: Secondary | ICD-10-CM | POA: Diagnosis not present

## 2017-02-06 DIAGNOSIS — M6281 Muscle weakness (generalized): Secondary | ICD-10-CM | POA: Diagnosis not present

## 2017-02-06 DIAGNOSIS — M545 Low back pain: Secondary | ICD-10-CM | POA: Diagnosis not present

## 2017-02-10 DIAGNOSIS — Z0000 Encounter for general adult medical examination without abnormal findings: Secondary | ICD-10-CM | POA: Diagnosis not present

## 2017-02-10 DIAGNOSIS — E1143 Type 2 diabetes mellitus with diabetic autonomic (poly)neuropathy: Secondary | ICD-10-CM | POA: Diagnosis not present

## 2017-02-10 DIAGNOSIS — Z1389 Encounter for screening for other disorder: Secondary | ICD-10-CM | POA: Diagnosis not present

## 2017-02-10 DIAGNOSIS — Z6825 Body mass index (BMI) 25.0-25.9, adult: Secondary | ICD-10-CM | POA: Diagnosis not present

## 2017-02-10 DIAGNOSIS — M5432 Sciatica, left side: Secondary | ICD-10-CM | POA: Diagnosis not present

## 2017-02-10 DIAGNOSIS — E7439 Other disorders of intestinal carbohydrate absorption: Secondary | ICD-10-CM | POA: Diagnosis not present

## 2017-02-10 DIAGNOSIS — E1165 Type 2 diabetes mellitus with hyperglycemia: Secondary | ICD-10-CM | POA: Diagnosis not present

## 2017-02-19 DIAGNOSIS — Z6825 Body mass index (BMI) 25.0-25.9, adult: Secondary | ICD-10-CM | POA: Diagnosis not present

## 2017-02-19 DIAGNOSIS — M5432 Sciatica, left side: Secondary | ICD-10-CM | POA: Diagnosis not present

## 2017-02-27 DIAGNOSIS — M545 Low back pain: Secondary | ICD-10-CM | POA: Diagnosis not present

## 2017-02-27 DIAGNOSIS — M5432 Sciatica, left side: Secondary | ICD-10-CM | POA: Diagnosis not present

## 2017-02-27 DIAGNOSIS — M4316 Spondylolisthesis, lumbar region: Secondary | ICD-10-CM | POA: Diagnosis not present

## 2017-02-27 DIAGNOSIS — M48062 Spinal stenosis, lumbar region with neurogenic claudication: Secondary | ICD-10-CM | POA: Diagnosis not present

## 2017-02-27 DIAGNOSIS — M4726 Other spondylosis with radiculopathy, lumbar region: Secondary | ICD-10-CM | POA: Diagnosis not present

## 2017-03-01 DIAGNOSIS — M4726 Other spondylosis with radiculopathy, lumbar region: Secondary | ICD-10-CM | POA: Diagnosis not present

## 2017-03-01 DIAGNOSIS — M545 Low back pain: Secondary | ICD-10-CM | POA: Diagnosis not present

## 2017-03-01 DIAGNOSIS — M4807 Spinal stenosis, lumbosacral region: Secondary | ICD-10-CM | POA: Diagnosis not present

## 2017-03-20 DIAGNOSIS — M48062 Spinal stenosis, lumbar region with neurogenic claudication: Secondary | ICD-10-CM | POA: Diagnosis not present

## 2017-03-20 DIAGNOSIS — M4316 Spondylolisthesis, lumbar region: Secondary | ICD-10-CM | POA: Diagnosis not present

## 2017-03-20 DIAGNOSIS — Z6825 Body mass index (BMI) 25.0-25.9, adult: Secondary | ICD-10-CM | POA: Diagnosis not present

## 2017-03-26 ENCOUNTER — Other Ambulatory Visit: Payer: Self-pay | Admitting: Thoracic Surgery (Cardiothoracic Vascular Surgery)

## 2017-03-26 DIAGNOSIS — I7121 Aneurysm of the ascending aorta, without rupture: Secondary | ICD-10-CM

## 2017-03-26 DIAGNOSIS — I712 Thoracic aortic aneurysm, without rupture: Secondary | ICD-10-CM

## 2017-04-18 DIAGNOSIS — M5432 Sciatica, left side: Secondary | ICD-10-CM | POA: Diagnosis not present

## 2017-04-18 DIAGNOSIS — M48062 Spinal stenosis, lumbar region with neurogenic claudication: Secondary | ICD-10-CM | POA: Diagnosis not present

## 2017-04-18 DIAGNOSIS — Z6825 Body mass index (BMI) 25.0-25.9, adult: Secondary | ICD-10-CM | POA: Diagnosis not present

## 2017-04-18 DIAGNOSIS — M4316 Spondylolisthesis, lumbar region: Secondary | ICD-10-CM | POA: Diagnosis not present

## 2017-04-21 DIAGNOSIS — K5732 Diverticulitis of large intestine without perforation or abscess without bleeding: Secondary | ICD-10-CM | POA: Diagnosis not present

## 2017-04-21 DIAGNOSIS — K2270 Barrett's esophagus without dysplasia: Secondary | ICD-10-CM | POA: Diagnosis not present

## 2017-04-21 DIAGNOSIS — K219 Gastro-esophageal reflux disease without esophagitis: Secondary | ICD-10-CM | POA: Diagnosis not present

## 2017-05-05 DIAGNOSIS — M5432 Sciatica, left side: Secondary | ICD-10-CM | POA: Diagnosis not present

## 2017-05-06 ENCOUNTER — Ambulatory Visit
Admission: RE | Admit: 2017-05-06 | Discharge: 2017-05-06 | Disposition: A | Discharge status: Home / Self Care | Payer: Medicare Other | Source: Ambulatory Visit | Attending: Thoracic Surgery (Cardiothoracic Vascular Surgery) | Admitting: Thoracic Surgery (Cardiothoracic Vascular Surgery)

## 2017-05-06 ENCOUNTER — Encounter: Payer: Self-pay | Admitting: Thoracic Surgery (Cardiothoracic Vascular Surgery)

## 2017-05-06 ENCOUNTER — Ambulatory Visit (INDEPENDENT_AMBULATORY_CARE_PROVIDER_SITE_OTHER): Payer: Medicare Other | Admitting: Thoracic Surgery (Cardiothoracic Vascular Surgery)

## 2017-05-06 VITALS — BP 94/50 | HR 53 | Resp 16 | Ht 67.0 in | Wt 167.0 lb

## 2017-05-06 DIAGNOSIS — Z85118 Personal history of other malignant neoplasm of bronchus and lung: Secondary | ICD-10-CM

## 2017-05-06 DIAGNOSIS — I7121 Aneurysm of the ascending aorta, without rupture: Secondary | ICD-10-CM

## 2017-05-06 DIAGNOSIS — I712 Thoracic aortic aneurysm, without rupture: Secondary | ICD-10-CM | POA: Diagnosis not present

## 2017-05-06 NOTE — Progress Notes (Signed)
[address]       [place] [address]             [phone]     HPI: Mr. Kamara returns for a scheduled 6 month follow-up visit  Mr. 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 He was being followed with routine CT scans following his lung cancer resection. In 2014 he was noted to have a 5 cm ascending aneurysm. Looking back at his CTs it had been present since at least 2011.  His primary complaint currently is back pain. He has back injected yesterday. His pain is a little better since then. He denies any chest pain, pressure, or tightness. Has not had any exacerbations of his COPD. His blood pressure has been well-controlled at home. He denies any dizziness or orthostatic symptoms.  Past Medical History:  Diagnosis Date  . Cancer ([place]) 2011   lung  . COPD (chronic obstructive pulmonary disease) ([place])   . Hypertension   . 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 albuterol (PROVENTIL HFA;VENTOLIN HFA) 108 (90 BASE) MCG/ACT inhaler Inhale into the lungs every 6 (six) hours as needed for wheezing or shortness of breath.    Marland Kitchen aspirin 81 MG tablet Take 81 mg by mouth daily.    . budesonide-formoterol (SYMBICORT) 80-4.5 MCG/ACT inhaler Inhale 2 puffs into the lungs 2 (two) times daily.    . clopidogrel (PLAVIX) 75 MG tablet Take 75 mg by mouth daily.    . famotidine (PEPCID AC) 10 MG chewable tablet Chew 10 mg by mouth 2 (two) times daily.    Marland Kitchen gabapentin (NEURONTIN) 300 MG capsule Take 300 mg by mouth 2 (two) times daily. 300 mg in the AM and 600 mg at NIGHT    . losartan (COZAAR) 25 MG tablet Take 25 mg by mouth daily.    . metFORMIN (GLUCOPHAGE-XR) 500 MG 24 hr tablet Take 500 mg by mouth daily with breakfast.    . metoprolol  (TOPROL-XL) 200 MG 24 hr tablet Take 200 mg by mouth daily.    . nitroGLYCERIN (NITROSTAT) 0.4 MG SL tablet Place 0.4 mg under the tongue.    . pantoprazole (PROTONIX) 40 MG tablet Take 40 mg by mouth daily.    . simvastatin (ZOCOR) 5 MG tablet Take 5 mg by mouth daily.    . tamsulosin (FLOMAX) 0.4 MG CAPS Take 0.4 mg by mouth daily.    . traZODone (DESYREL) 100 MG tablet Take 100 mg by mouth at bedtime.    . triamcinolone ointment (KENALOG) 0.1 % 0.1 application.    . finasteride (PROSCAR) 5 MG tablet Take 5 mg by mouth daily.     No current facility-administered medications for this visit.     Physical Exam BP (!) 94/50 (BP Location: Right Arm, Patient Position: Sitting, Cuff Size: Large)   Pulse (!) 53   Resp 16   Ht 5\' 7"  (1.702 m)   Wt 167 lb (75.8 kg)   SpO2 96% Comment: RA  BMI 26.56 kg/m  76 year old man in no acute distress Alert and oriented 3 with no focal deficits No carotid bruits Cardiac regular rate and rhythm, normal S1 and S2, no murmur Lungs clear with equal breath sounds bilaterally  Diagnostic Tests: CT CHEST WITHOUT CONTRAST  TECHNIQUE: Multidetector CT imaging of the chest was performed following the standard protocol without IV contrast.  COMPARISON:  10/15/2016 and 04/09/2016  FINDINGS: Cardiovascular: Ascending thoracic aortic aneurysm measures 5.3 cm in maximum diameter, without significant change compared to previous studies. Descending thoracic aortic aneurysm is again seen measuring 4.6 cm in maximum diameter, also without significant change when remeasured on prior study. Aortic and coronary artery atherosclerosis.  Mediastinum/Nodes: No masses or pathologically enlarged lymph nodes identified on this unenhanced exam.  Lungs/Pleura: Stable postop changes in left lung. Stable tiny subpleural nodules in right middle lobe, consistent with benign etiology. No evidence of pleural effusion.  Upper Abdomen:   Unremarkable.  Musculoskeletal:  No suspicious bone lesions.  IMPRESSION: No significant change in 5.3 cm ascending aortic aneurysm and 4.6 cm descending thoracic aortic aneurysm. Ascending thoracic aortic aneurysm. Recommend semi-annual imaging followup by CT. This recommendation follows 2010 ACCF/AHA/AATS/ACR/ASA/SCA/SCAI/SIR/STS/SVM Guidelines for the Diagnosis and Management of Patients With Thoracic Aortic Disease. Circulation. 2010; 121: I264-B583  Stable postop changes in left hemithorax. No evidence of recurrent or metastatic carcinoma.  Aortic Atherosclerosis (ICD10-I70.0).   Electronically Signed   By: Earle Gell M.D.   On: 05/06/2017 10:10 I personally reviewed the CT and concur with the findings noted above. Do think there is been a slight increase in size of the ascending and descending aneurysm.  Impression: Mr. Herbers is a 76 year old man with a history of abdominal aortic aneurysm treated with stent grafting. He has been followed for ascending and descending thoracic aneurysms for the past 4 years. The aneurysms of been present dating back at least 7 years. There does appear to have been slight interval growth in 1-2 mm over the past 6 months. There is still no indication for surgery, but he is approaching 5.5 cm.  Hypertension- blood pressure is well controlled currently. He has systolic pressures in the 90s and his heart rate is in the 50s. He is on a large dose of metoprolol. He is not currently having any symptoms from his bradycardia or low blood pressure so I will not change his medications at this time.  Plan: Return in 6 months with CT angiogram of chest to evaluate ascending and descending thoracic aortic aneurysms  Melrose Nakayama, MD [facility] [phone]

## 2017-06-26 DIAGNOSIS — J0100 Acute maxillary sinusitis, unspecified: Secondary | ICD-10-CM | POA: Diagnosis not present

## 2017-07-14 DIAGNOSIS — N400 Enlarged prostate without lower urinary tract symptoms: Secondary | ICD-10-CM | POA: Diagnosis not present

## 2017-07-14 DIAGNOSIS — E785 Hyperlipidemia, unspecified: Secondary | ICD-10-CM | POA: Diagnosis not present

## 2017-07-14 DIAGNOSIS — K219 Gastro-esophageal reflux disease without esophagitis: Secondary | ICD-10-CM | POA: Diagnosis not present

## 2017-07-14 DIAGNOSIS — E119 Type 2 diabetes mellitus without complications: Secondary | ICD-10-CM | POA: Diagnosis not present

## 2017-07-14 DIAGNOSIS — Z6826 Body mass index (BMI) 26.0-26.9, adult: Secondary | ICD-10-CM | POA: Diagnosis not present

## 2017-07-25 DIAGNOSIS — Z23 Encounter for immunization: Secondary | ICD-10-CM | POA: Diagnosis not present

## 2017-08-04 DIAGNOSIS — M5431 Sciatica, right side: Secondary | ICD-10-CM | POA: Diagnosis not present

## 2017-08-14 DIAGNOSIS — M542 Cervicalgia: Secondary | ICD-10-CM | POA: Diagnosis not present

## 2017-08-18 DIAGNOSIS — M47816 Spondylosis without myelopathy or radiculopathy, lumbar region: Secondary | ICD-10-CM | POA: Diagnosis not present

## 2017-08-18 DIAGNOSIS — M4802 Spinal stenosis, cervical region: Secondary | ICD-10-CM | POA: Diagnosis not present

## 2017-08-18 DIAGNOSIS — M542 Cervicalgia: Secondary | ICD-10-CM | POA: Diagnosis not present

## 2017-08-18 DIAGNOSIS — M4712 Other spondylosis with myelopathy, cervical region: Secondary | ICD-10-CM | POA: Diagnosis not present

## 2017-08-28 DIAGNOSIS — M5031 Other cervical disc degeneration,  high cervical region: Secondary | ICD-10-CM | POA: Diagnosis not present

## 2017-08-28 DIAGNOSIS — R200 Anesthesia of skin: Secondary | ICD-10-CM | POA: Diagnosis not present

## 2017-08-28 DIAGNOSIS — Z981 Arthrodesis status: Secondary | ICD-10-CM | POA: Diagnosis not present

## 2017-08-28 DIAGNOSIS — M4712 Other spondylosis with myelopathy, cervical region: Secondary | ICD-10-CM | POA: Diagnosis not present

## 2017-08-28 DIAGNOSIS — M4802 Spinal stenosis, cervical region: Secondary | ICD-10-CM | POA: Diagnosis not present

## 2017-09-01 DIAGNOSIS — M47816 Spondylosis without myelopathy or radiculopathy, lumbar region: Secondary | ICD-10-CM | POA: Diagnosis not present

## 2017-09-02 DIAGNOSIS — Z85118 Personal history of other malignant neoplasm of bronchus and lung: Secondary | ICD-10-CM

## 2017-09-02 DIAGNOSIS — I252 Old myocardial infarction: Secondary | ICD-10-CM

## 2017-09-02 HISTORY — DX: Old myocardial infarction: I25.2

## 2017-09-02 HISTORY — DX: Personal history of other malignant neoplasm of bronchus and lung: Z85.118

## 2017-09-06 DIAGNOSIS — J0100 Acute maxillary sinusitis, unspecified: Secondary | ICD-10-CM | POA: Diagnosis not present

## 2017-09-09 DIAGNOSIS — M4712 Other spondylosis with myelopathy, cervical region: Secondary | ICD-10-CM | POA: Diagnosis not present

## 2017-09-09 DIAGNOSIS — Z6825 Body mass index (BMI) 25.0-25.9, adult: Secondary | ICD-10-CM | POA: Diagnosis not present

## 2017-09-09 DIAGNOSIS — M4802 Spinal stenosis, cervical region: Secondary | ICD-10-CM | POA: Diagnosis not present

## 2017-09-09 DIAGNOSIS — M961 Postlaminectomy syndrome, not elsewhere classified: Secondary | ICD-10-CM | POA: Diagnosis not present

## 2017-09-25 ENCOUNTER — Other Ambulatory Visit: Payer: Self-pay | Admitting: *Deleted

## 2017-09-25 DIAGNOSIS — I712 Thoracic aortic aneurysm, without rupture: Secondary | ICD-10-CM

## 2017-09-25 DIAGNOSIS — I7121 Aneurysm of the ascending aorta, without rupture: Secondary | ICD-10-CM

## 2017-09-29 DIAGNOSIS — M47816 Spondylosis without myelopathy or radiculopathy, lumbar region: Secondary | ICD-10-CM | POA: Diagnosis not present

## 2017-10-01 ENCOUNTER — Other Ambulatory Visit: Payer: Self-pay | Admitting: *Deleted

## 2017-10-01 DIAGNOSIS — I712 Thoracic aortic aneurysm, without rupture, unspecified: Secondary | ICD-10-CM

## 2017-10-15 DIAGNOSIS — M4712 Other spondylosis with myelopathy, cervical region: Secondary | ICD-10-CM | POA: Diagnosis not present

## 2017-10-17 ENCOUNTER — Other Ambulatory Visit: Payer: Medicare Other

## 2017-11-04 ENCOUNTER — Ambulatory Visit
Admission: RE | Admit: 2017-11-04 | Discharge: 2017-11-04 | Disposition: A | Discharge status: Home / Self Care | Payer: Medicare Other | Source: Ambulatory Visit | Attending: Thoracic Surgery (Cardiothoracic Vascular Surgery) | Admitting: Thoracic Surgery (Cardiothoracic Vascular Surgery)

## 2017-11-04 ENCOUNTER — Ambulatory Visit (INDEPENDENT_AMBULATORY_CARE_PROVIDER_SITE_OTHER): Payer: Medicare Other | Admitting: Thoracic Surgery (Cardiothoracic Vascular Surgery)

## 2017-11-04 ENCOUNTER — Encounter: Payer: Self-pay | Admitting: Thoracic Surgery (Cardiothoracic Vascular Surgery)

## 2017-11-04 VITALS — BP 121/69 | HR 55 | Resp 20 | Ht 67.0 in | Wt 167.0 lb

## 2017-11-04 DIAGNOSIS — I7121 Aneurysm of the ascending aorta, without rupture: Secondary | ICD-10-CM

## 2017-11-04 DIAGNOSIS — I712 Thoracic aortic aneurysm, without rupture, unspecified: Secondary | ICD-10-CM

## 2017-11-04 DIAGNOSIS — I7123 Aneurysm of the descending thoracic aorta, without rupture: Secondary | ICD-10-CM

## 2017-11-04 MED ORDER — IOPAMIDOL (ISOVUE-370) INJECTION 76%
75.0000 mL | Freq: Once | INTRAVENOUS | Status: AC | PRN
  Administered 2017-11-04: 75 mL via INTRAVENOUS

## 2017-11-04 NOTE — Progress Notes (Signed)
[address]       [place] [address]             [phone]     HPI: Mr. Rasheed returns for a six-month follow-up  Mr. 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  He was being followed with routine CT scans after his lung resection.  In 2014 he was noted to have a 5 cm ascending aneurysm.  In retrospect that was present going back to at least 2011 but had just not been noted in the reports.  We have been following him on a six-month basis.  There is been little change in the aneurysm.  He continues to have difficulty with back pain.  He says that he is had injections which did not help.  He is hoping to have surgery for that, but has not worked out the details.  He denies any chest pain, pressure, or tightness.  He has not had any COPD exacerbations.  He checks his blood pressure at home and that has not been a problem.  Past Medical History:  Diagnosis Date  . Cancer ([place]) 2011   lung  . COPD (chronic obstructive pulmonary disease) ([place])   . Hypertension   . 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 albuterol (PROVENTIL HFA;VENTOLIN HFA) 108 (90 BASE) MCG/ACT inhaler Inhale into the lungs every 6 (six) hours as needed for wheezing or shortness of breath.    Marland Kitchen aspirin 81 MG tablet Take 81 mg by mouth daily.    . budesonide-formoterol (SYMBICORT) 80-4.5 MCG/ACT inhaler Inhale 2 puffs into the lungs 2 (two) times daily.    . clopidogrel (PLAVIX) 75 MG tablet Take 75 mg by mouth daily.    . famotidine (PEPCID AC) 10 MG chewable tablet Chew 10 mg by mouth 2 (two) times daily.    . finasteride (PROSCAR) 5 MG tablet Take 5 mg by mouth daily.    Marland Kitchen gabapentin (NEURONTIN) 300 MG capsule Take 300 mg by mouth 2 (two)  times daily. 300 mg in the AM and 600 mg at NIGHT    . losartan (COZAAR) 25 MG tablet Take 25 mg by mouth daily.    . metFORMIN (GLUCOPHAGE-XR) 500 MG 24 hr tablet Take 500 mg by mouth daily with breakfast.    . metoprolol (TOPROL-XL) 200 MG 24 hr tablet Take 200 mg by mouth daily.    . nitroGLYCERIN (NITROSTAT) 0.4 MG SL tablet Place 0.4 mg under the tongue.    . pantoprazole (PROTONIX) 40 MG tablet Take 40 mg by mouth daily.    . simvastatin (ZOCOR) 5 MG tablet Take 5 mg by mouth daily.    . tamsulosin (FLOMAX) 0.4 MG CAPS Take 0.4 mg by mouth daily.    . traZODone (DESYREL) 100 MG tablet Take 100 mg by mouth at bedtime.    . triamcinolone ointment (KENALOG) 0.1 % 0.1 application.     No current facility-administered medications for this visit.     Physical Exam BP 121/69   Pulse (!) 55   Resp 20   Ht 5\' 7"  (1.702 m)   Wt 167 lb (75.8 kg)   SpO2 95% Comment: RA  BMI 26.54 kg/m  77 year old man in no acute distress Alert and oriented  x3 with no focal deficits Lungs diminished at left base, no rales or wheezing Cardiac regular rate and rhythm, no audible murmur No peripheral edema  Diagnostic Tests: CT ANGIOGRAPHY CHEST WITH CONTRAST  TECHNIQUE: Multidetector CT imaging of the chest was performed using the standard protocol during bolus administration of intravenous contrast. Multiplanar CT image reconstructions and MIPs were obtained to evaluate the vascular anatomy.  CONTRAST:  69mL ISOVUE-370 IOPAMIDOL (ISOVUE-370) INJECTION 76%  COMPARISON:  05/06/2017 unenhanced study, 10/15/2016 contrast-enhanced study.  FINDINGS: Cardiovascular: Stable aneurysmal disease of the thoracic aorta and proximal great vessels. The aortic root measures 3.9 cm at the sinuses of Valsalva. The ascending thoracic aorta measures 5.3 cm in greatest caliber. The proximal arch measures 4 cm. The distal arch measures 3.7 cm. The mid descending thoracic aorta measures 4.5 cm in greatest  diameter. The distal descending thoracic aorta measures 3.3 cm. No evidence of aortic dissection or rupture.  Bovine common origin the innominate and left common carotid artery measures 2.6 cm and is stable. The innominate artery measures 2 cm. The proximal right subclavian artery measures 1.6 cm.  The heart size is stable. No pericardial fluid. Stable coronary atherosclerosis with evidence of prior coronary stent placement.  Mediastinum/Nodes: No enlarged mediastinal, hilar, or axillary lymph nodes. Thyroid gland, trachea, and esophagus demonstrate no significant findings.  Lungs/Pleura: Stable volume loss and scarring of the left lung with evidence of prior left lung resection. No pulmonary masses, airspace disease or edema identified. No pleural effusions.  Upper Abdomen: No significant findings. Upper end of a fenestrated abdominal aortic endograft is visualized.  Musculoskeletal: No chest wall abnormality. No acute or significant osseous findings.  Review of the MIP images confirms the above findings.  IMPRESSION: Stable aneurysmal disease of the thoracic aorta and great vessels.  Aortic aneurysm NOS (ICD10-I71.9).   Electronically Signed   By: Aletta Edouard M.D.   On: 11/04/2017 13:37 I personally reviewed the CT images and concur with the findings noted above  Impression: Mr. Tapp is a 77 year old man with a past history significant for tobacco abuse, COPD, and aneurysmal disease of the thoracic and abdominal aorta.  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  There is no indication for surgery at present.  Hypertension-blood pressure has been well controlled at home.  Normal today.  Continue current medications.  Aortic atherosclerosis-on Zocor.  COPD-well-controlled on current regimen of Symbicort.  Seldom needs rescue inhaler.  Lung cancer-now 10 years out with no  evidence of recurrence  Plan: Return in 6 months with CT angios chest  Melrose Nakayama, MD [facility] [phone]

## 2017-11-18 DIAGNOSIS — M4712 Other spondylosis with myelopathy, cervical region: Secondary | ICD-10-CM | POA: Diagnosis not present

## 2017-11-18 DIAGNOSIS — M47816 Spondylosis without myelopathy or radiculopathy, lumbar region: Secondary | ICD-10-CM | POA: Diagnosis not present

## 2017-11-18 DIAGNOSIS — M961 Postlaminectomy syndrome, not elsewhere classified: Secondary | ICD-10-CM | POA: Diagnosis not present

## 2017-12-03 DIAGNOSIS — M47817 Spondylosis without myelopathy or radiculopathy, lumbosacral region: Secondary | ICD-10-CM | POA: Diagnosis not present

## 2017-12-03 DIAGNOSIS — M47816 Spondylosis without myelopathy or radiculopathy, lumbar region: Secondary | ICD-10-CM | POA: Diagnosis not present

## 2017-12-10 DIAGNOSIS — J0100 Acute maxillary sinusitis, unspecified: Secondary | ICD-10-CM | POA: Diagnosis not present

## 2017-12-22 DIAGNOSIS — Z6825 Body mass index (BMI) 25.0-25.9, adult: Secondary | ICD-10-CM | POA: Diagnosis not present

## 2017-12-22 DIAGNOSIS — H538 Other visual disturbances: Secondary | ICD-10-CM | POA: Diagnosis not present

## 2017-12-24 DIAGNOSIS — H4922 Sixth [abducent] nerve palsy, left eye: Secondary | ICD-10-CM | POA: Diagnosis not present

## 2017-12-24 DIAGNOSIS — H532 Diplopia: Secondary | ICD-10-CM | POA: Diagnosis not present

## 2017-12-29 DIAGNOSIS — H532 Diplopia: Secondary | ICD-10-CM | POA: Diagnosis not present

## 2017-12-29 DIAGNOSIS — R51 Headache: Secondary | ICD-10-CM | POA: Diagnosis not present

## 2018-01-07 DIAGNOSIS — H4922 Sixth [abducent] nerve palsy, left eye: Secondary | ICD-10-CM | POA: Diagnosis not present

## 2018-02-13 DIAGNOSIS — Z6825 Body mass index (BMI) 25.0-25.9, adult: Secondary | ICD-10-CM | POA: Diagnosis not present

## 2018-02-13 DIAGNOSIS — J302 Other seasonal allergic rhinitis: Secondary | ICD-10-CM | POA: Diagnosis not present

## 2018-02-13 DIAGNOSIS — M545 Low back pain: Secondary | ICD-10-CM | POA: Diagnosis not present

## 2018-02-26 DIAGNOSIS — R5383 Other fatigue: Secondary | ICD-10-CM | POA: Diagnosis not present

## 2018-02-26 DIAGNOSIS — Z1331 Encounter for screening for depression: Secondary | ICD-10-CM | POA: Diagnosis not present

## 2018-02-26 DIAGNOSIS — Z0000 Encounter for general adult medical examination without abnormal findings: Secondary | ICD-10-CM | POA: Diagnosis not present

## 2018-02-26 DIAGNOSIS — M5440 Lumbago with sciatica, unspecified side: Secondary | ICD-10-CM | POA: Diagnosis not present

## 2018-02-26 DIAGNOSIS — I259 Chronic ischemic heart disease, unspecified: Secondary | ICD-10-CM | POA: Diagnosis not present

## 2018-02-26 DIAGNOSIS — Z79899 Other long term (current) drug therapy: Secondary | ICD-10-CM | POA: Diagnosis not present

## 2018-02-26 DIAGNOSIS — E1165 Type 2 diabetes mellitus with hyperglycemia: Secondary | ICD-10-CM | POA: Diagnosis not present

## 2018-02-26 DIAGNOSIS — Z9181 History of falling: Secondary | ICD-10-CM | POA: Diagnosis not present

## 2018-02-26 DIAGNOSIS — E785 Hyperlipidemia, unspecified: Secondary | ICD-10-CM | POA: Diagnosis not present

## 2018-03-06 DIAGNOSIS — I2510 Atherosclerotic heart disease of native coronary artery without angina pectoris: Secondary | ICD-10-CM | POA: Diagnosis not present

## 2018-03-06 DIAGNOSIS — J449 Chronic obstructive pulmonary disease, unspecified: Secondary | ICD-10-CM | POA: Diagnosis not present

## 2018-03-06 DIAGNOSIS — I10 Essential (primary) hypertension: Secondary | ICD-10-CM | POA: Diagnosis not present

## 2018-03-06 DIAGNOSIS — E785 Hyperlipidemia, unspecified: Secondary | ICD-10-CM | POA: Diagnosis not present

## 2018-03-06 DIAGNOSIS — R079 Chest pain, unspecified: Secondary | ICD-10-CM | POA: Diagnosis not present

## 2018-03-06 DIAGNOSIS — I712 Thoracic aortic aneurysm, without rupture: Secondary | ICD-10-CM | POA: Diagnosis not present

## 2018-03-16 DIAGNOSIS — M542 Cervicalgia: Secondary | ICD-10-CM | POA: Diagnosis not present

## 2018-03-16 DIAGNOSIS — Z6825 Body mass index (BMI) 25.0-25.9, adult: Secondary | ICD-10-CM | POA: Diagnosis not present

## 2018-03-16 DIAGNOSIS — M4726 Other spondylosis with radiculopathy, lumbar region: Secondary | ICD-10-CM | POA: Diagnosis not present

## 2018-03-16 DIAGNOSIS — M4316 Spondylolisthesis, lumbar region: Secondary | ICD-10-CM | POA: Diagnosis not present

## 2018-03-27 ENCOUNTER — Other Ambulatory Visit: Payer: Self-pay | Admitting: *Deleted

## 2018-03-27 DIAGNOSIS — I712 Thoracic aortic aneurysm, without rupture, unspecified: Secondary | ICD-10-CM

## 2018-04-22 DIAGNOSIS — I259 Chronic ischemic heart disease, unspecified: Secondary | ICD-10-CM

## 2018-04-22 DIAGNOSIS — K2270 Barrett's esophagus without dysplasia: Secondary | ICD-10-CM | POA: Diagnosis not present

## 2018-04-22 DIAGNOSIS — G479 Sleep disorder, unspecified: Secondary | ICD-10-CM

## 2018-04-22 DIAGNOSIS — I10 Essential (primary) hypertension: Secondary | ICD-10-CM

## 2018-04-22 DIAGNOSIS — E1165 Type 2 diabetes mellitus with hyperglycemia: Secondary | ICD-10-CM

## 2018-04-22 DIAGNOSIS — IMO0002 Reserved for concepts with insufficient information to code with codable children: Secondary | ICD-10-CM

## 2018-04-22 DIAGNOSIS — I2510 Atherosclerotic heart disease of native coronary artery without angina pectoris: Secondary | ICD-10-CM

## 2018-04-22 DIAGNOSIS — R1032 Left lower quadrant pain: Secondary | ICD-10-CM | POA: Diagnosis not present

## 2018-04-22 DIAGNOSIS — K219 Gastro-esophageal reflux disease without esophagitis: Secondary | ICD-10-CM | POA: Diagnosis not present

## 2018-04-22 HISTORY — DX: Chronic ischemic heart disease, unspecified: I25.9

## 2018-04-22 HISTORY — DX: Type 2 diabetes mellitus with hyperglycemia: E11.65

## 2018-04-22 HISTORY — DX: Sleep disorder, unspecified: G47.9

## 2018-04-22 HISTORY — DX: Essential (primary) hypertension: I10

## 2018-04-22 HISTORY — DX: Reserved for concepts with insufficient information to code with codable children: IMO0002

## 2018-04-22 HISTORY — DX: Atherosclerotic heart disease of native coronary artery without angina pectoris: I25.10

## 2018-04-23 ENCOUNTER — Encounter: Payer: Self-pay | Admitting: Cardiology

## 2018-04-23 ENCOUNTER — Ambulatory Visit (INDEPENDENT_AMBULATORY_CARE_PROVIDER_SITE_OTHER): Payer: Medicare Other | Admitting: Cardiology

## 2018-04-23 VITALS — BP 121/72 | HR 63 | Ht 67.0 in | Wt 159.8 lb

## 2018-04-23 DIAGNOSIS — I252 Old myocardial infarction: Secondary | ICD-10-CM | POA: Diagnosis not present

## 2018-04-23 DIAGNOSIS — I259 Chronic ischemic heart disease, unspecified: Secondary | ICD-10-CM

## 2018-04-23 DIAGNOSIS — J449 Chronic obstructive pulmonary disease, unspecified: Secondary | ICD-10-CM

## 2018-04-23 DIAGNOSIS — I712 Thoracic aortic aneurysm, without rupture, unspecified: Secondary | ICD-10-CM

## 2018-04-23 DIAGNOSIS — E1165 Type 2 diabetes mellitus with hyperglycemia: Secondary | ICD-10-CM

## 2018-04-23 DIAGNOSIS — I10 Essential (primary) hypertension: Secondary | ICD-10-CM

## 2018-04-23 DIAGNOSIS — I739 Peripheral vascular disease, unspecified: Secondary | ICD-10-CM | POA: Diagnosis not present

## 2018-04-23 NOTE — Progress Notes (Signed)
Cardiology Office Note:    Date:  04/23/2018   ID:  Drew [name], DOB 03-31-1941, MRN [id number]  PCP:  Angelina Sheriff, MD  Cardiologist:  Jenne Campus, MD    Referring MD: Angelina Sheriff, MD   No chief complaint on file. I would like to be established as a patient  History of Present Illness:    Drew [name] is a 77 y.o. male with complex past medical history about 15 years ago he suffered from myocardial infarction PTCA and stenting was done but then it looks like he we occluded the same artery while later and then another stent was placed in the same artery interestingly every single time he suffered from microinfarction he had very atypical symptoms he was weak tired exhausted and actually first time he was noted to be bradycardic I suspect he did have a problem with the right coronary artery however I have no documentation of it.  He does not know it.  Additional history includes ascending aortic aneurysm measuring 5.2 cm also descending thoracic aneurysm measuring 4.6 cm.  He denies having any symptoms no chest pain tightness squeezing pressure burning chest pain while walking he will get short of breath easily.  Denies having any claudication.  There is no palpitations.  He does not have any difficulty sleeping.  Past Medical History:  Diagnosis Date  . Aortic aneurysm ([place])   . Barrett's esophagus   . Cancer ([place]) 2011   lung  . COPD (chronic obstructive pulmonary disease) ([place])   . Hydrocele   . Hyperlipidemia   . Hypertension   . Ischemic heart disease   . Peripheral vascular disease ([place])   . 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 ABDOMINAL AORTIC ANEURYSM REPAIR W/ ENDOLUMINAL GRAFT  1999  . CORONARY ARTERY BYPASS GRAFT    . CORONARY STENT PLACEMENT  2003  . left lower lobectomy  2012    Current Medications: Current Meds  Medication Sig  . acetaminophen-codeine  (TYLENOL #4) 300-60 MG tablet Take 1 tablet by mouth 2 (two) times daily as needed for pain.  Marland Kitchen aspirin 81 MG tablet Take 81 mg by mouth daily.  . budesonide-formoterol (SYMBICORT) 80-4.5 MCG/ACT inhaler Inhale 2 puffs into the lungs 2 (two) times daily.  . clopidogrel (PLAVIX) 75 MG tablet Take 75 mg by mouth daily.  . DULoxetine (CYMBALTA) 60 MG capsule Take 1 capsule by mouth daily.  . famotidine (PEPCID AC) 10 MG chewable tablet Chew 10 mg by mouth 2 (two) times daily.  . finasteride (PROSCAR) 5 MG tablet Take 5 mg by mouth daily.  . folic acid (FOLVITE) 1 MG tablet Take 1 mg by mouth daily.  Marland Kitchen gabapentin (NEURONTIN) 300 MG capsule Take 300 mg by mouth 2 (two) times daily. 300 mg in the AM and 600 mg at NIGHT  . loratadine (CLARITIN) 10 MG tablet Take 10 mg by mouth daily.  Marland Kitchen losartan (COZAAR) 25 MG tablet Take 25 mg by mouth daily.  . metFORMIN (GLUCOPHAGE-XR) 500 MG 24 hr tablet Take 500 mg by mouth 2 (two) times daily.   . metoprolol (TOPROL-XL) 200 MG 24 hr tablet Take 200 mg by mouth daily.  . montelukast (SINGULAIR) 10 MG tablet Take 1 tablet by mouth daily.  . nitroGLYCERIN (NITROSTAT) 0.4 MG SL tablet Place 0.4 mg under the tongue.  . Omega-3 Fatty Acids (OMEGA 3 500 PO) Take 1 capsule  by mouth daily.  . pantoprazole (PROTONIX) 40 MG tablet Take 40 mg by mouth daily.  . rosuvastatin (CRESTOR) 10 MG tablet Take 1 tablet by mouth daily.  . tamsulosin (FLOMAX) 0.4 MG CAPS Take 0.4 mg by mouth daily.  Marland Kitchen tiotropium (SPIRIVA) 18 MCG inhalation capsule Place 18 mcg into inhaler and inhale daily.  . traZODone (DESYREL) 100 MG tablet Take 100 mg by mouth at bedtime.  . vitamin B-12 (CYANOCOBALAMIN) 1000 MCG tablet Take 1,000 mcg by mouth daily.     Allergies:   Ethanol; Tramadol hcl; Keflex [cephalexin]; and Neomycin   Social History   Socioeconomic History  . Marital status: Married    Spouse name: Not on file  . Number of children: Not on file  . Years of education: Not on file    . Highest education level: Not on file  Occupational History  . Not on file  Social Needs  . Financial resource strain: Not on file  . Food insecurity:    Worry: Not on file    Inability: Not on file  . Transportation needs:    Medical: Not on file    Non-medical: Not on file  Tobacco Use  . Smoking status: Former Smoker    Packs/day: 2.00    Years: 36.00    Pack years: 72.00    Types: Cigarettes    Last attempt to quit: 10/14/1988    Years since quitting: 29.5  . Smokeless tobacco: Former Systems developer    Quit date: 04/09/1988  Substance and Sexual Activity  . Alcohol use: No    Alcohol/week: 0.0 oz  . Drug use: Not on file  . Sexual activity: Not on file  Lifestyle  . Physical activity:    Days per week: Not on file    Minutes per session: Not on file  . Stress: Not on file  Relationships  . Social connections:    Talks on phone: Not on file    Gets together: Not on file    Attends religious service: Not on file    Active member of club or organization: Not on file    Attends meetings of clubs or organizations: Not on file    Relationship status: Not on file  Other Topics Concern  . Not on file  Social History Narrative  . Not on file     Family History: The patient's family history includes Brain cancer in his mother; CAD in his father; Heart disease in his father and mother; Lung cancer in his father; Prostate cancer in his father; Thyroid disease in his sister. ROS:   Please see the history of present illness.    All 14 point review of systems negative except as described per history of present illness  EKGs/Labs/Other Studies Reviewed:      Recent Labs: No results found for requested labs within last 8760 hours.  Recent Lipid Panel No results found for: CHOL, TRIG, HDL, CHOLHDL, VLDL, LDLCALC, LDLDIRECT  Physical Exam:    VS:  BP 121/72 (BP Location: Right Arm, Patient Position: Sitting, Cuff Size: Normal)   Pulse 63   Ht 5\' 7"  (1.702 m)   Wt 159 lb 12.8  oz (72.5 kg)   SpO2 95%   BMI 25.03 kg/m     Wt Readings from Last 3 Encounters:  04/23/18 159 lb 12.8 oz (72.5 kg)  11/04/17 167 lb (75.8 kg)  05/06/17 167 lb (75.8 kg)     GEN:  Well nourished, well developed in no acute  distress HEENT: Normal NECK: No JVD; No carotid bruits LYMPHATICS: No lymphadenopathy CARDIAC: RRR, no murmurs, no rubs, no gallops RESPIRATORY:  Clear to auscultation without rales, wheezing or rhonchi  ABDOMEN: Soft, non-tender, non-distended MUSCULOSKELETAL:  No edema; No deformity  SKIN: Warm and dry LOWER EXTREMITIES: no swelling NEUROLOGIC:  Alert and oriented x 3 PSYCHIATRIC:  Normal affect   ASSESSMENT:    1. Old MI (myocardial infarction)   2. Benign essential hypertension   3. Chronic ischemic heart disease   4. COPD mixed type ([place])   5. Uncontrolled type 2 diabetes mellitus with hyperglycemia ([place])   6. Thoracic aortic aneurysm without rupture ([place])    PLAN:    In order of problems listed above:  1. 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  His EKG done today showed normal sinus rhythm with evidence of inferior wall microinfarction nonspecific ST segment changes.  He is already on aspirin Plavix and I will continue with that he is taking statin however only low intensity he can benefit from high intensity statin I will call primary care physician to get his last liver function test and based on that we will decide about augmentation of the therapy he is already on beta-blocker which I will continue.  He also can benefit from ACE inhibitor he is taking ARB which should be sufficient. 2. Benign essential hypertension his blood pressure appears to be well controlled continue present management. 3. Chronic ischemic heart disease in the future he may require stress testing even though he lacks typical symptoms however, previously before his microinfarction he did not have any  symptoms 4. Diabetes followed by internal medicine team apparently better control now but still somewhat need to be done. 5. Thoracic aortic aneurysm: Follow-up by cardiothoracic surgery.  He is scheduled to see them shortly. 6. Extensive atherosclerosis with aortic aneurysm.  I will schedule him to have carotid ultrasounds make sure he does not have any significant stenosis there.  I did not hear any bruit but with his extensive atherosclerosis it some months to check his carotids.  In the meantime he will maintain on aspirin and Plavix.  Overall fairly sick gentleman with multiple medical problems.  We will do echocardiogram to assess left ventricular ejection fraction, will also schedule him to have carotid ultrasound.  We will try to augment his lipid-lowering therapy.  I see him back in my office in 1 month   Medication Adjustments/Labs and Tests Ordered: Current medicines are reviewed at length with the patient today.  Concerns regarding medicines are outlined above.  No orders of the defined types were placed in this encounter.  Medication changes: No orders of the defined types were placed in this encounter.   Signed, Park Liter, MD, [facility] 04/23/2018 2:03 PM    [place]

## 2018-04-23 NOTE — Patient Instructions (Signed)
Medication Instructions:  Your physician recommends that you continue on your current medications as directed. Please refer to the Current Medication list given to you today.  Labwork: None  Testing/Procedures: Your physician has requested that you have an echocardiogram. Echocardiography is a painless test that uses sound waves to create images of your heart. It provides your doctor with information about the size and shape of your heart and how well your heart's chambers and valves are working. This procedure takes approximately one hour. There are no restrictions for this procedure.  Your physician has requested that you have a carotid duplex. This test is an ultrasound of the carotid arteries in your neck. It looks at blood flow through these arteries that supply the brain with blood. Allow one hour for this exam. There are no restrictions or special instructions.  Follow-Up: Your physician recommends that you schedule a follow-up appointment in: 1 month  Any Other Special Instructions Will Be Listed Below (If Applicable).     If you need a refill on your cardiac medications before your next appointment, please call your pharmacy.   [place], RN, BSN

## 2018-04-28 ENCOUNTER — Ambulatory Visit (INDEPENDENT_AMBULATORY_CARE_PROVIDER_SITE_OTHER): Payer: Medicare Other | Admitting: Thoracic Surgery (Cardiothoracic Vascular Surgery)

## 2018-04-28 ENCOUNTER — Encounter: Payer: Self-pay | Admitting: Thoracic Surgery (Cardiothoracic Vascular Surgery)

## 2018-04-28 ENCOUNTER — Other Ambulatory Visit: Payer: Self-pay

## 2018-04-28 ENCOUNTER — Ambulatory Visit
Admission: RE | Admit: 2018-04-28 | Discharge: 2018-04-28 | Disposition: A | Discharge status: Home / Self Care | Payer: Medicare Other | Source: Ambulatory Visit | Attending: Thoracic Surgery (Cardiothoracic Vascular Surgery) | Admitting: Thoracic Surgery (Cardiothoracic Vascular Surgery)

## 2018-04-28 VITALS — BP 142/80 | HR 57 | Resp 18 | Ht 67.0 in | Wt 159.0 lb

## 2018-04-28 DIAGNOSIS — I259 Chronic ischemic heart disease, unspecified: Secondary | ICD-10-CM | POA: Diagnosis not present

## 2018-04-28 DIAGNOSIS — I712 Thoracic aortic aneurysm, without rupture, unspecified: Secondary | ICD-10-CM

## 2018-04-28 DIAGNOSIS — I7121 Aneurysm of the ascending aorta, without rupture: Secondary | ICD-10-CM

## 2018-04-28 DIAGNOSIS — I10 Essential (primary) hypertension: Secondary | ICD-10-CM | POA: Diagnosis not present

## 2018-04-28 MED ORDER — IOPAMIDOL (ISOVUE-370) INJECTION 76%
75.0000 mL | Freq: Once | INTRAVENOUS | Status: AC | PRN
  Administered 2018-04-28: 75 mL via INTRAVENOUS

## 2018-04-28 NOTE — Progress Notes (Signed)
[address]       [place] [address]             [phone]    HPI: Drew [name] returns for follow-up of his thoracic aortic aneurysm  Drew [name] is a 76 year old [name] with a history of hypertension, ascending aneurysm, abdominal aortic aneurysm status post stent grafting, tobacco abuse, lung cancer status post lingular resection, and COPD.  He was followed with CT scans after his lung resection.  It was noted in 2014 that he had a 5 cm ascending aneurysm.  This had actually been present back to 2011 but not previously noted.  He feels well.  He has lost some weight.  He continues to have difficulty with back pain.  He had some nerve blocks which helped a little but did not completely eliminate it.  He has not had any chest pain, pressure, or tightness.  His COPD status is been stable.  Past Medical History:  Diagnosis Date  . Aortic aneurysm ([place])   . Barrett's esophagus   . Cancer ([place]) 2011   lung  . COPD (chronic obstructive pulmonary disease) ([place])   . Hydrocele   . Hyperlipidemia   . Hypertension   . Ischemic heart disease   . Peripheral vascular disease ([place])   . 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 acetaminophen-codeine (TYLENOL #4) 300-60 MG tablet Take 1 tablet by mouth 2 (two) times daily as needed for pain.    Marland Kitchen aspirin 81 MG tablet Take 81 mg by mouth daily.    . budesonide-formoterol (SYMBICORT) 80-4.5 MCG/ACT inhaler Inhale 2 puffs into the lungs 2 (two) times daily.    . clopidogrel (PLAVIX) 75 MG tablet Take 75 mg by mouth daily.    . DULoxetine (CYMBALTA) 60 MG capsule Take 1 capsule by mouth daily.    . finasteride (PROSCAR) 5 MG tablet Take 5 mg by mouth daily.    . folic acid (FOLVITE) 1 MG tablet Take 1 mg by mouth daily.    Marland Kitchen gabapentin (NEURONTIN) 300 MG capsule Take 300 mg by mouth 2 (two) times daily. 300 mg in the AM and 600 mg at  NIGHT    . loratadine (CLARITIN) 10 MG tablet Take 10 mg by mouth daily.    Marland Kitchen losartan (COZAAR) 25 MG tablet Take 25 mg by mouth daily.    . metFORMIN (GLUCOPHAGE-XR) 500 MG 24 hr tablet Take 500 mg by mouth 2 (two) times daily.     . metoprolol (TOPROL-XL) 200 MG 24 hr tablet Take 200 mg by mouth daily.    . montelukast (SINGULAIR) 10 MG tablet Take 1 tablet by mouth daily.    . nitroGLYCERIN (NITROSTAT) 0.4 MG SL tablet Place 0.4 mg under the tongue.    . Omega-3 Fatty Acids (OMEGA 3 500 PO) Take 1 capsule by mouth daily.    . pantoprazole (PROTONIX) 40 MG tablet Take 40 mg by mouth daily.    . rosuvastatin (CRESTOR) 10 MG tablet Take 1 tablet by mouth daily.    . tamsulosin (FLOMAX) 0.4 MG CAPS Take 0.4 mg by mouth daily.    Marland Kitchen tiotropium (SPIRIVA) 18 MCG inhalation capsule Place 18 mcg into inhaler and inhale daily.    . traZODone (DESYREL) 100 MG tablet Take 100 mg by mouth at bedtime.    . vitamin B-12 (CYANOCOBALAMIN) 1000 MCG tablet Take 1,000 mcg by  mouth daily.    . famotidine (PEPCID) 20 MG tablet Take 20 mg by mouth at bedtime.  3   No current facility-administered medications for this visit.     Physical Exam BP (!) 142/80 (BP Location: Left Arm, Patient Position: Sitting, Cuff Size: Normal)   Pulse (!) 57   Resp 18   Ht 5\' 7"  (1.702 m)   Wt 159 lb (72.1 kg)   SpO2 94% Comment: RA  BMI 24.40 kg/m  Drew [name] in no acute distress Alert and oriented x3 with no focal deficits No carotid bruits Cardiac regular rate and rhythm with a 2/6 systolic murmur Lungs clear with equal breath sounds bilaterally No peripheral edema  Diagnostic Tests: CT ANGIOGRAPHY CHEST WITH CONTRAST  TECHNIQUE: Multidetector CT imaging of the chest was performed using the standard protocol during bolus administration of intravenous contrast. Multiplanar CT image reconstructions and MIPs were obtained to evaluate the vascular anatomy.  CONTRAST:  35mL ISOVUE-370 IOPAMIDOL (ISOVUE-370)  INJECTION 76%  Creatinine was obtained on site at [facility] at [address]  Results: Creatinine 0.7 mg/dL.  Estimated GFR 92 mL/minute  COMPARISON:  11/04/2017  FINDINGS: Cardiovascular: Aortic measurements are essentially stable. The aortic root measures 3.8 cm at the level of the sinuses of Valsalva. The ascending thoracic aorta measures 5.2 cm in greatest diameter. The proximal arch measures 4.1 cm. The distal arch measures 3.7 cm. The proximal descending thoracic aorta reaches maximal caliber of 4.5 cm.  There is a change in appearance of the distal descending thoracic aorta. New area of crescentic material along the posterior wall of the distal descending thoracic aorta was not present previously and measures up to 9 mm in maximal thickness. At this level the aorta measures approximately 4.7-4.8 cm in maximal diameter. This crescentic area does appear to be higher in attenuation than expected for simple mural thrombus and a component of intramural hemorrhage cannot be excluded. Correlation suggested with any recent symptoms of lower thoracic pain. Without precontrast unenhanced scans, it is difficult to determine if this truly represents intramural hemorrhage. This does not have the appearance of overt dissection. The aorta measures 3.3 cm just above the diaphragmatic hiatus.  Proximal great vessels show stable measurements with bovine origin dilatation of 2.6 cm, innominate artery diameter of 2.0 cm and proximal right subclavian maximal diameter of 1.5 cm.  Stable coronary atherosclerosis. The heart size is stable and at the upper limits of normal. No pericardial fluid identified.  Mediastinum/Nodes: No enlarged mediastinal, hilar, or axillary lymph nodes. Thyroid gland, trachea, and esophagus demonstrate no significant findings.  Lungs/Pleura: Stable scarring and postoperative appearance following prior lingular resection. No pulmonary  masses, airspace consolidation, edema or pneumothorax identified. No pleural effusions.  Upper Abdomen: No acute abnormality.  Musculoskeletal: No chest wall abnormality. No acute or significant osseous findings.  Review of the MIP images confirms the above findings.  IMPRESSION: Stable aneurysmal disease of the ascending thoracic aorta and proximal great vessels. The ascending thoracic aorta measures 5.2 cm in greatest diameter by CTA today. The only change on the current study is a new focal crescentic area of high density along the posterior wall of the distal descending thoracic aorta at which the aorta now measures approximately 4.7-4.8 cm in maximum diameter. This higher attenuation area is not typical for simple mural thrombus and a component of intramural hemorrhage cannot be excluded. Correlation suggested with any recent symptoms of lower thoracic pain.  Aortic aneurysm NOS (ICD10-I71.9).   Electronically Signed  By: Aletta Edouard M.D.   On: 04/28/2018 13:07 I personally reviewed the CT images and concur with the findings noted above.  Impression: Mr. Westberg is a 77 year old [name] with a 5.2 cm ascending aneurysm.  This first noted on CT in 2011 and measured 5 cm at that time.  It has been stable at 5.2 cm over the last couple of years.  Not sure what to make of the high density area along the posterior wall of the distal descending thoracic aorta.  He does have an aneurysm in that area as well.  This could be a focal dissection that thrombosed or could be intramural hemorrhage from a penetrating ulcer.  He has severe back pain at baseline but has not had any acute episodes that were dramatically worse.  There is no other intervention but to follow this for now. He needs continued semi-annual follow-up.  Hypertension-blood pressure mildly elevated today.  He does check it at home and says he runs 120/70.  I emphasized the importance of monitoring that at home on  a regular basis.  He should let us know if his systolic is consistently running above 130.   Plan: Return in 6 months with CT angiogram of chest  Melrose Nakayama, MD [facility] [phone]

## 2018-05-15 DIAGNOSIS — I714 Abdominal aortic aneurysm, without rupture: Secondary | ICD-10-CM | POA: Diagnosis not present

## 2018-05-15 DIAGNOSIS — Z7982 Long term (current) use of aspirin: Secondary | ICD-10-CM | POA: Diagnosis not present

## 2018-05-15 DIAGNOSIS — I2510 Atherosclerotic heart disease of native coronary artery without angina pectoris: Secondary | ICD-10-CM | POA: Diagnosis not present

## 2018-05-15 DIAGNOSIS — I739 Peripheral vascular disease, unspecified: Secondary | ICD-10-CM | POA: Diagnosis not present

## 2018-05-15 DIAGNOSIS — K22710 Barrett's esophagus with low grade dysplasia: Secondary | ICD-10-CM | POA: Diagnosis not present

## 2018-05-15 DIAGNOSIS — Z85118 Personal history of other malignant neoplasm of bronchus and lung: Secondary | ICD-10-CM | POA: Diagnosis not present

## 2018-05-15 DIAGNOSIS — F17210 Nicotine dependence, cigarettes, uncomplicated: Secondary | ICD-10-CM | POA: Diagnosis not present

## 2018-05-15 DIAGNOSIS — K449 Diaphragmatic hernia without obstruction or gangrene: Secondary | ICD-10-CM | POA: Diagnosis not present

## 2018-05-15 DIAGNOSIS — K2970 Gastritis, unspecified, without bleeding: Secondary | ICD-10-CM | POA: Diagnosis not present

## 2018-05-15 DIAGNOSIS — Z955 Presence of coronary angioplasty implant and graft: Secondary | ICD-10-CM | POA: Diagnosis not present

## 2018-05-15 DIAGNOSIS — I10 Essential (primary) hypertension: Secondary | ICD-10-CM | POA: Diagnosis not present

## 2018-05-15 DIAGNOSIS — J449 Chronic obstructive pulmonary disease, unspecified: Secondary | ICD-10-CM | POA: Diagnosis not present

## 2018-05-15 DIAGNOSIS — Z79899 Other long term (current) drug therapy: Secondary | ICD-10-CM | POA: Diagnosis not present

## 2018-05-15 DIAGNOSIS — K229 Disease of esophagus, unspecified: Secondary | ICD-10-CM | POA: Diagnosis not present

## 2018-05-15 DIAGNOSIS — K2270 Barrett's esophagus without dysplasia: Secondary | ICD-10-CM | POA: Diagnosis not present

## 2018-05-15 DIAGNOSIS — K219 Gastro-esophageal reflux disease without esophagitis: Secondary | ICD-10-CM | POA: Diagnosis not present

## 2018-05-20 DIAGNOSIS — J301 Allergic rhinitis due to pollen: Secondary | ICD-10-CM | POA: Diagnosis not present

## 2018-05-21 ENCOUNTER — Other Ambulatory Visit: Payer: Self-pay

## 2018-05-21 ENCOUNTER — Ambulatory Visit (INDEPENDENT_AMBULATORY_CARE_PROVIDER_SITE_OTHER): Payer: Medicare Other

## 2018-05-21 DIAGNOSIS — I252 Old myocardial infarction: Secondary | ICD-10-CM

## 2018-05-21 DIAGNOSIS — I739 Peripheral vascular disease, unspecified: Secondary | ICD-10-CM

## 2018-05-21 NOTE — Progress Notes (Signed)
Complete echocardiogram has been performed.  Jimmy Samirah Scarpati RDCS 

## 2018-05-21 NOTE — Progress Notes (Signed)
Complete carotid duplex has been performed.  Drew [name]

## 2018-05-26 ENCOUNTER — Encounter: Payer: Self-pay | Admitting: Cardiology

## 2018-05-26 ENCOUNTER — Ambulatory Visit (INDEPENDENT_AMBULATORY_CARE_PROVIDER_SITE_OTHER): Payer: Medicare Other | Admitting: Cardiology

## 2018-05-26 VITALS — BP 102/68 | HR 70 | Ht 67.0 in | Wt 158.4 lb

## 2018-05-26 DIAGNOSIS — E7800 Pure hypercholesterolemia, unspecified: Secondary | ICD-10-CM

## 2018-05-26 DIAGNOSIS — I10 Essential (primary) hypertension: Secondary | ICD-10-CM

## 2018-05-26 DIAGNOSIS — I712 Thoracic aortic aneurysm, without rupture, unspecified: Secondary | ICD-10-CM

## 2018-05-26 DIAGNOSIS — I2510 Atherosclerotic heart disease of native coronary artery without angina pectoris: Secondary | ICD-10-CM

## 2018-05-26 DIAGNOSIS — I739 Peripheral vascular disease, unspecified: Secondary | ICD-10-CM

## 2018-05-26 MED ORDER — ROSUVASTATIN CALCIUM 20 MG PO TABS: 20.0000 mg | ORAL_TABLET | Freq: Every day | ORAL | 3 refills | Status: AC

## 2018-05-26 NOTE — Progress Notes (Signed)
Cardiology Office Note:    Date:  05/26/2018   ID:  Drew [name], DOB January 14, 1941, MRN [id number]  PCP:  Angelina Sheriff, MD  Cardiologist:  Jenne Campus, MD    Referring MD: Angelina Sheriff, MD   Chief Complaint  Patient presents with  . Follow-up    1 month after echocardiogram and carotid U/S  Doing well  History of Present Illness:    Drew [name] is a 77 y.o. male with coronary artery disease status post PTCA and stenting to the right coronary artery years ago recently relocated to the area.  Overall doing fair complain of weakness fatigue tiredness.  Complain of having shortness of breath while walking admits that he does not do too much she is a he sits in front of TV all day.  I did echocardiogram on him to assess left ventricular ejection fraction happened to be mildly diminished with segmental wall motion abnormality with inferior basal severe hypokinesis.  He also had carotid ultrasounds which showed 60 to 79% right internal carotid artery stenosis.  He denies having any recent TIA or CVA-like symptoms.  Past Medical History:  Diagnosis Date  . Aortic aneurysm ([place])   . Barrett's esophagus   . Cancer ([place]) 2011   lung  . COPD (chronic obstructive pulmonary disease) ([place])   . Hydrocele   . Hyperlipidemia   . Hypertension   . Ischemic heart disease   . Peripheral vascular disease ([place])   . 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 ABDOMINAL AORTIC ANEURYSM REPAIR W/ ENDOLUMINAL GRAFT  1999  . CORONARY ARTERY BYPASS GRAFT    . CORONARY STENT PLACEMENT  2003  . left lower lobectomy  2012    Current Medications: Current Meds  Medication Sig  . acetaminophen-codeine (TYLENOL #4) 300-60 MG tablet Take 1 tablet by mouth 2 (two) times daily as needed for pain.  Marland Kitchen aspirin 81 MG tablet Take 81 mg by mouth daily.  . budesonide-formoterol (SYMBICORT) 80-4.5 MCG/ACT inhaler  Inhale 2 puffs into the lungs 2 (two) times daily.  . budesonide-formoterol (SYMBICORT) 80-4.5 MCG/ACT inhaler Inhale 2 puffs into the lungs 2 (two) times daily.  . cetirizine (ZYRTEC) 10 MG tablet Take 10 mg by mouth daily.  . Cholecalciferol (VITAMIN D3) 10000 units TABS Take 1 tablet by mouth daily.  . clopidogrel (PLAVIX) 75 MG tablet Take 75 mg by mouth daily.  . DULoxetine (CYMBALTA) 60 MG capsule Take 1 capsule by mouth daily.  . famotidine (PEPCID) 10 MG tablet Take 10 mg by mouth at bedtime.   . finasteride (PROSCAR) 5 MG tablet Take 5 mg by mouth daily.  . folic acid (FOLVITE) 1 MG tablet Take 1 mg by mouth daily.  Marland Kitchen gabapentin (NEURONTIN) 300 MG capsule Take 300 mg by mouth 2 (two) times daily. 300 mg in the AM and 600 mg at NIGHT  . losartan (COZAAR) 25 MG tablet Take 25 mg by mouth daily.  . metFORMIN (GLUCOPHAGE-XR) 500 MG 24 hr tablet Take 500 mg by mouth 2 (two) times daily.   . metoprolol (TOPROL-XL) 200 MG 24 hr tablet Take 200 mg by mouth daily.  . montelukast (SINGULAIR) 10 MG tablet Take 1 tablet by mouth daily.  . nitroGLYCERIN (NITROSTAT) 0.4 MG SL tablet Place 0.4 mg under the tongue.  . Omega-3 Fatty Acids (OMEGA 3 500 [name]) Take 1 capsule by mouth daily.  . pantoprazole (PROTONIX)  40 MG tablet Take 40 mg by mouth daily.  . rosuvastatin (CRESTOR) 10 MG tablet Take 1 tablet by mouth daily.  . tamsulosin (FLOMAX) 0.4 MG CAPS Take 0.4 mg by mouth daily.  . traZODone (DESYREL) 100 MG tablet Take 100 mg by mouth at bedtime.  . vitamin B-12 (CYANOCOBALAMIN) 1000 MCG tablet Take 1,000 mcg by mouth daily.     Allergies:   Ethanol; Tramadol hcl; Keflex [cephalexin]; and Neomycin   Social History   Socioeconomic History  . Marital status: Married    Spouse name: Not on file  . Number of children: Not on file  . Years of education: Not on file  . Highest education level: Not on file  Occupational History  . Not on file  Social Needs  . Financial resource strain: Not on  file  . Food insecurity:    Worry: Not on file    Inability: Not on file  . Transportation needs:    Medical: Not on file    Non-medical: Not on file  Tobacco Use  . Smoking status: Former Smoker    Packs/day: 2.00    Years: 36.00    Pack years: 72.00    Types: Cigarettes    Last attempt to quit: 10/14/1988    Years since quitting: 29.6  . Smokeless tobacco: Former Systems developer    Quit date: 04/09/1988  Substance and Sexual Activity  . Alcohol use: No    Alcohol/week: 0.0 standard drinks  . Drug use: Not Currently  . Sexual activity: Not on file  Lifestyle  . Physical activity:    Days per week: Not on file    Minutes per session: Not on file  . Stress: Not on file  Relationships  . Social connections:    Talks on phone: Not on file    Gets together: Not on file    Attends religious service: Not on file    Active member of club or organization: Not on file    Attends meetings of clubs or organizations: Not on file    Relationship status: Not on file  Other Topics Concern  . Not on file  Social History Narrative  . Not on file     Family History: The patient's family history includes Brain cancer in his mother; CAD in his father; Heart disease in his father and mother; Lung cancer in his father; Prostate cancer in his father; Thyroid disease in his sister. ROS:   Please see the history of present illness.    All 14 point review of systems negative except as described per history of present illness  EKGs/Labs/Other Studies Reviewed:      Recent Labs: No results found for requested labs within last 8760 hours.  Recent Lipid Panel No results found for: CHOL, TRIG, HDL, CHOLHDL, VLDL, LDLCALC, LDLDIRECT  Physical Exam:    VS:  BP 102/68 (BP Location: Left Arm, Patient Position: Sitting, Cuff Size: Normal)   Pulse 70   Ht 5\' 7"  (1.702 m)   Wt 158 lb 6.4 oz (71.8 kg)   SpO2 96%   BMI 24.81 kg/m     Wt Readings from Last 3 Encounters:  05/26/18 158 lb 6.4 oz (71.8 kg)    04/28/18 159 lb (72.1 kg)  04/23/18 159 lb 12.8 oz (72.5 kg)     GEN:  Well nourished, well developed in no acute distress HEENT: Normal NECK: No JVD; No carotid bruits LYMPHATICS: No lymphadenopathy CARDIAC: RRR, no murmurs, no rubs, no gallops RESPIRATORY:  Clear to auscultation without rales, wheezing or rhonchi  ABDOMEN: Soft, non-tender, non-distended MUSCULOSKELETAL:  No edema; No deformity  SKIN: Warm and dry LOWER EXTREMITIES: no swelling NEUROLOGIC:  Alert and oriented x 3 PSYCHIATRIC:  Normal affect   ASSESSMENT:    1. Coronary artery disease involving native coronary artery of native heart without angina pectoris   2. Hypercholesteremia   3. Benign essential hypertension   4. Thoracic aortic aneurysm without rupture ([place])   5. Peripheral vascular disease ([place])    PLAN:    In order of problems listed above:  1. Coronary artery disease: Stable denies having any chest pain.  I encouraged him to be L be more active I see him back in my office in about 3 months to see how he does he may require a stress test to see if there is any residual coronary artery disease. 2. Dyslipidemia he need to be on high intensity statin I will increase dose of Crestor from 10-20 then in about 6 weeks we will repeat fasting lipid profile and HDL T. 3. Benign essential hypertension blood pressure well controlled continue present management. 4. Cardiomyopathy I will try to increase the dose of ARB.  Chem-7 will be checked today and based on that we will decide if we can do that. 5. Aneurysm followed by vascular surgeon stable 6. Carotid arterial disease with 60 to 79% stenosis on the right side there is no TIA or CVA within last 6 months.  We will continue monitoring his rate on aspirin and Plavix.  Will augment antilipid therapy.  We will continue with ARB.   Medication Adjustments/Labs and Tests Ordered: Current medicines are reviewed at length with the patient today.  Concerns regarding  medicines are outlined above.  No orders of the defined types were placed in this encounter.  Medication changes: No orders of the defined types were placed in this encounter.   Signed, Park Liter, MD, [facility] 05/26/2018 2:03 PM    [place]

## 2018-05-26 NOTE — Patient Instructions (Signed)
Medication Instructions:  Your physician has recommended you make the following change in your medication:   Increase: rosuvastatin to 20 mg daily.     Labwork: Your physician recommends that you return for lab work today: [facility]  Your physician recommends that you return for lab work in 6 weeks: Lipids, AST, ALT   Testing/Procedures: None.  Follow-Up: Your physician wants you to follow-up in: 3 months. You will receive a reminder letter in the mail two months in advance. If you don't receive a letter, please call our office to schedule the follow-up appointment.   Any Other Special Instructions Will Be Listed Below (If Applicable).     If you need a refill on your cardiac medications before your next appointment, please call your pharmacy.

## 2018-05-27 LAB — BASIC METABOLIC PANEL
BUN/Creatinine Ratio: 17 (ref 10–24)
BUN: 13 mg/dL (ref 8–27)
CO2: 23 mmol/L (ref 20–29)
Calcium: 9.5 mg/dL (ref 8.6–10.2)
Chloride: 95 mmol/L — ABNORMAL LOW (ref 96–106)
Creatinine, Ser: 0.77 mg/dL (ref 0.76–1.27)
GFR calc Af Amer: 102 mL/min/{1.73_m2} (ref >59)
GFR calc non Af Amer: 88 mL/min/{1.73_m2} (ref >59)
GLUCOSE: 183 mg/dL — AB (ref 65–99)
Potassium: 4 mmol/L (ref 3.5–5.2)
Sodium: 138 mmol/L (ref 134–144)

## 2018-05-29 DIAGNOSIS — L929 Granulomatous disorder of the skin and subcutaneous tissue, unspecified: Secondary | ICD-10-CM | POA: Diagnosis not present

## 2018-06-02 ENCOUNTER — Other Ambulatory Visit: Payer: Self-pay

## 2018-06-02 DIAGNOSIS — I10 Essential (primary) hypertension: Secondary | ICD-10-CM

## 2018-06-02 DIAGNOSIS — I2510 Atherosclerotic heart disease of native coronary artery without angina pectoris: Secondary | ICD-10-CM

## 2018-06-02 NOTE — Addendum Note (Signed)
Addended by: Mattie Marlin on: 06/02/2018 03:52 PM   Modules accepted: Orders

## 2018-06-09 DIAGNOSIS — E7800 Pure hypercholesterolemia, unspecified: Secondary | ICD-10-CM | POA: Diagnosis not present

## 2018-06-09 DIAGNOSIS — I10 Essential (primary) hypertension: Secondary | ICD-10-CM | POA: Diagnosis not present

## 2018-06-09 DIAGNOSIS — I2510 Atherosclerotic heart disease of native coronary artery without angina pectoris: Secondary | ICD-10-CM | POA: Diagnosis not present

## 2018-06-09 LAB — BASIC METABOLIC PANEL
BUN / CREAT RATIO: 15 (ref 10–24)
BUN: 14 mg/dL (ref 8–27)
CHLORIDE: 95 mmol/L — AB (ref 96–106)
CO2: 24 mmol/L (ref 20–29)
CREATININE: 0.96 mg/dL (ref 0.76–1.27)
Calcium: 10.2 mg/dL (ref 8.6–10.2)
GFR calc Af Amer: 88 mL/min/{1.73_m2} (ref >59)
GFR calc non Af Amer: 76 mL/min/{1.73_m2} (ref >59)
Glucose: 123 mg/dL — ABNORMAL HIGH (ref 65–99)
Potassium: 5.5 mmol/L — ABNORMAL HIGH (ref 3.5–5.2)
Sodium: 136 mmol/L (ref 134–144)

## 2018-06-09 LAB — LIPID PANEL
CHOLESTEROL TOTAL: 112 mg/dL (ref 100–199)
Chol/HDL Ratio: 4.1 ratio (ref 0.0–5.0)
HDL: 27 mg/dL — ABNORMAL LOW (ref >39)
LDL CALC: 43 mg/dL (ref 0–99)
Triglycerides: 208 mg/dL — ABNORMAL HIGH (ref 0–149)
VLDL CHOLESTEROL CAL: 42 mg/dL — AB (ref 5–40)

## 2018-06-09 LAB — ALT: ALT: 22 IU/L (ref 0–44)

## 2018-06-09 LAB — AST: AST: 21 IU/L (ref 0–40)

## 2018-06-12 ENCOUNTER — Telehealth: Payer: Self-pay | Admitting: *Deleted

## 2018-06-12 DIAGNOSIS — E875 Hyperkalemia: Secondary | ICD-10-CM

## 2018-06-12 MED ORDER — LOSARTAN POTASSIUM 25 MG PO TABS: 25.0000 mg | ORAL_TABLET | Freq: Every day | ORAL | Status: DC

## 2018-06-12 NOTE — Telephone Encounter (Signed)
-----   Message from Park Liter, MD sent at 06/11/2018 11:10 AM EDT ----- Hyperkalemia Lower losartan to previous dose,  chem7 in 1 week

## 2018-06-12 NOTE — Telephone Encounter (Signed)
Patient informed of results. Patient informed to decrease losartan from 50 mg (1 tablet) to 25 mg daily (0.5 tablet) and go to the [place] office for repeat lab work in 1 week. Patient verbalized understanding. No further questions.

## 2018-08-28 ENCOUNTER — Ambulatory Visit (INDEPENDENT_AMBULATORY_CARE_PROVIDER_SITE_OTHER): Payer: Medicare Other | Admitting: Cardiology

## 2018-08-28 ENCOUNTER — Encounter: Payer: Self-pay | Admitting: Cardiology

## 2018-08-28 VITALS — BP 134/72 | HR 54 | Ht 67.0 in | Wt 157.2 lb

## 2018-08-28 DIAGNOSIS — E7800 Pure hypercholesterolemia, unspecified: Secondary | ICD-10-CM

## 2018-08-28 DIAGNOSIS — I10 Essential (primary) hypertension: Secondary | ICD-10-CM

## 2018-08-28 DIAGNOSIS — I259 Chronic ischemic heart disease, unspecified: Secondary | ICD-10-CM

## 2018-08-28 DIAGNOSIS — I2510 Atherosclerotic heart disease of native coronary artery without angina pectoris: Secondary | ICD-10-CM

## 2018-08-28 NOTE — Progress Notes (Signed)
Cardiology Office Note:    Date:  08/28/2018   ID:  Drew [name], DOB Nov 08, 1940, MRN [id number]  PCP:  Angelina Sheriff, MD  Cardiologist:  Jenne Campus, MD    Referring MD: Angelina Sheriff, MD   Chief Complaint  Patient presents with  . Follow-up  Doing well  History of Present Illness:    Drew [name] is a 77 y.o. male with coronary artery disease status post PTCA and stenting of the right coronary artery many years ago with mildly diminished left ventricular ejection fraction with evidence of inferior wall myocardial infarction.  Also ascending aortic aneurysm that being followed by CT surgery.  Diabetes hypertension dyslipidemia comes to my office for follow-up overall doing well denies have any chest pain tightness squeezing pressure burning chest.  Been fairly active during the summer and fall taking care of his garden but now since winter is coming he is much less active he does not have any exertional symptoms except for some shortness of breath.  No TIA CVA-like symptoms.  Past Medical History:  Diagnosis Date  . Aortic aneurysm ([place])   . Barrett's esophagus   . Cancer ([place]) 2011   lung  . COPD (chronic obstructive pulmonary disease) ([place])   . Hydrocele   . Hyperlipidemia   . Hypertension   . Ischemic heart disease   . Peripheral vascular disease ([place])   . 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 ABDOMINAL AORTIC ANEURYSM REPAIR W/ ENDOLUMINAL GRAFT  1999  . CORONARY ARTERY BYPASS GRAFT    . CORONARY STENT PLACEMENT  2003  . left lower lobectomy  2012    Current Medications: Current Meds  Medication Sig  . aspirin 81 MG tablet Take 81 mg by mouth daily.  . budesonide-formoterol (SYMBICORT) 80-4.5 MCG/ACT inhaler Inhale 2 puffs into the lungs 2 (two) times daily.  . clopidogrel (PLAVIX) 75 MG tablet Take 75 mg by mouth daily.  . DULoxetine (CYMBALTA) 60 MG capsule  Take 1 capsule by mouth daily.  . famotidine (PEPCID) 10 MG tablet Take 10 mg by mouth at bedtime.   . finasteride (PROSCAR) 5 MG tablet Take 5 mg by mouth daily.  . folic acid (FOLVITE) 1 MG tablet Take 1 mg by mouth daily.  Marland Kitchen gabapentin (NEURONTIN) 300 MG capsule Take 300 mg by mouth 2 (two) times daily. 300 mg in the AM and 600 mg at NIGHT  . losartan (COZAAR) 25 MG tablet Take 1 tablet (25 mg total) by mouth daily.  . metFORMIN (GLUCOPHAGE-XR) 500 MG 24 hr tablet Take 500 mg by mouth 2 (two) times daily.   . metoprolol (TOPROL-XL) 200 MG 24 hr tablet Take 200 mg by mouth daily.  . montelukast (SINGULAIR) 10 MG tablet Take 1 tablet by mouth daily.  . nitroGLYCERIN (NITROSTAT) 0.4 MG SL tablet Place 0.4 mg under the tongue.  . Omega-3 Fatty Acids (OMEGA 3 500 PO) Take 1 capsule by mouth daily.  . pantoprazole (PROTONIX) 40 MG tablet Take 40 mg by mouth daily.  . rosuvastatin (CRESTOR) 20 MG tablet Take 1 tablet (20 mg total) by mouth daily.  . tamsulosin (FLOMAX) 0.4 MG CAPS Take 0.4 mg by mouth daily.  . traZODone (DESYREL) 100 MG tablet Take 100 mg by mouth at bedtime.  . vitamin B-12 (CYANOCOBALAMIN) 1000 MCG tablet Take 1,000 mcg by mouth daily.     Allergies:   Ethanol; Tramadol  hcl; Keflex [cephalexin]; and Neomycin   Social History   Socioeconomic History  . Marital status: Married    Spouse name: Not on file  . Number of children: Not on file  . Years of education: Not on file  . Highest education level: Not on file  Occupational History  . Not on file  Social Needs  . Financial resource strain: Not on file  . Food insecurity:    Worry: Not on file    Inability: Not on file  . Transportation needs:    Medical: Not on file    Non-medical: Not on file  Tobacco Use  . Smoking status: Former Smoker    Packs/day: 2.00    Years: 36.00    Pack years: 72.00    Types: Cigarettes    Last attempt to quit: 10/14/1988    Years since quitting: 29.8  . Smokeless tobacco: Former  Systems developer    Quit date: 04/09/1988  Substance and Sexual Activity  . Alcohol use: No    Alcohol/week: 0.0 standard drinks  . Drug use: Not Currently  . Sexual activity: Not on file  Lifestyle  . Physical activity:    Days per week: Not on file    Minutes per session: Not on file  . Stress: Not on file  Relationships  . Social connections:    Talks on phone: Not on file    Gets together: Not on file    Attends religious service: Not on file    Active member of club or organization: Not on file    Attends meetings of clubs or organizations: Not on file    Relationship status: Not on file  Other Topics Concern  . Not on file  Social History Narrative  . Not on file     Family History: The patient's family history includes Brain cancer in his mother; CAD in his father; Heart disease in his father and mother; Lung cancer in his father; Prostate cancer in his father; Thyroid disease in his sister. ROS:   Please see the history of present illness.    All 14 point review of systems negative except as described per history of present illness  EKGs/Labs/Other Studies Reviewed:      Recent Labs: 06/09/2018: ALT 22; BUN 14; Creatinine, Ser 0.96; Potassium 5.5; Sodium 136  Recent Lipid Panel    Component Value Date/Time   CHOL 112 06/09/2018 1052   TRIG 208 (H) 06/09/2018 1052   HDL 27 (L) 06/09/2018 1052   CHOLHDL 4.1 06/09/2018 1052   LDLCALC 43 06/09/2018 1052    Physical Exam:    VS:  BP 134/72   Pulse (!) 54   Ht 5\' 7"  (1.702 m)   Wt 157 lb 3.2 oz (71.3 kg)   SpO2 95%   BMI 24.62 kg/m     Wt Readings from Last 3 Encounters:  08/28/18 157 lb 3.2 oz (71.3 kg)  05/26/18 158 lb 6.4 oz (71.8 kg)  04/28/18 159 lb (72.1 kg)     GEN:  Well nourished, well developed in no acute distress HEENT: Normal NECK: No JVD; No carotid bruits LYMPHATICS: No lymphadenopathy CARDIAC: RRR, no murmurs, no rubs, no gallops RESPIRATORY:  Clear to auscultation without rales, wheezing or  rhonchi  ABDOMEN: Soft, non-tender, non-distended MUSCULOSKELETAL:  No edema; No deformity  SKIN: Warm and dry LOWER EXTREMITIES: no swelling NEUROLOGIC:  Alert and oriented x 3 PSYCHIATRIC:  Normal affect   ASSESSMENT:    1. Benign essential hypertension  2. Chronic ischemic heart disease   3. Coronary artery disease involving native coronary artery of native heart without angina pectoris   4. Hypercholesteremia    PLAN:    In order of problems listed above:  1. Benign essential hypertension blood pressure appears to be well controlled we will continue present management. 2. Chronic ischemic heart disease and with mildly diminished left ventricular ejection fraction.  We will continue beta-blocker.  Denies having the symptoms. 3. Peripheral vascular disease with carotid arterial stenosis in February we will repeat carotid ultrasound. 4. Dyslipidemia last LDL was 43.  We will continue present management. 5. Diabetes doing well from that point review last hemoglobin A1c from May was 6.5.   Medication Adjustments/Labs and Tests Ordered: Current medicines are reviewed at length with the patient today.  Concerns regarding medicines are outlined above.  No orders of the defined types were placed in this encounter.  Medication changes: No orders of the defined types were placed in this encounter.   Signed, Park Liter, MD, [facility] 08/28/2018 2:31 PM    [place]

## 2018-08-28 NOTE — Patient Instructions (Signed)
Medication Instructions:  Your physician recommends that you continue on your current medications as directed. Please refer to the Current Medication list given to you today.  If you need a refill on your cardiac medications before your next appointment, please call your pharmacy.   Lab work: None.  If you have labs (blood work) drawn today and your tests are completely normal, you will receive your results only by: Marland Kitchen MyChart Message (if you have MyChart) OR . A paper copy in the mail If you have any lab test that is abnormal or we need to change your treatment, we will call you to review the results.  Testing/Procedures: Your physician has requested that you have a carotid duplex. This test is an ultrasound of the carotid arteries in your neck. It looks at blood flow through these arteries that supply the brain with blood. Allow one hour for this exam. There are no restrictions or special instructions.     Follow-Up: At [facility], you and your health needs are our priority.  As part of our continuing mission to provide you with exceptional heart care, we have created designated Provider Care Teams.  These Care Teams include your primary Cardiologist (physician) and Advanced Practice Providers (APPs -  Physician Assistants and Nurse Practitioners) who all work together to provide you with the care you need, when you need it. You will need a follow up appointment in 6 months.  Please call our office 2 months in advance to schedule this appointment.  You may see No primary care provider on file. or another member of our [employer] Provider Team in [place]: Shirlee More, MD . Jyl Heinz, MD  Any Other Special Instructions Will Be Listed Below (If Applicable).

## 2018-08-28 NOTE — Addendum Note (Signed)
Addended by: Ashok Norris on: 08/28/2018 02:40 PM   Modules accepted: Orders

## 2018-09-22 ENCOUNTER — Other Ambulatory Visit: Payer: Self-pay | Admitting: *Deleted

## 2018-09-22 DIAGNOSIS — I712 Thoracic aortic aneurysm, without rupture, unspecified: Secondary | ICD-10-CM

## 2018-10-23 ENCOUNTER — Ambulatory Visit
Admission: RE | Admit: 2018-10-23 | Discharge: 2018-10-23 | Disposition: A | Discharge status: Home / Self Care | Payer: Medicare Other | Source: Ambulatory Visit | Attending: Thoracic Surgery (Cardiothoracic Vascular Surgery) | Admitting: Thoracic Surgery (Cardiothoracic Vascular Surgery)

## 2018-10-23 DIAGNOSIS — I712 Thoracic aortic aneurysm, without rupture, unspecified: Secondary | ICD-10-CM

## 2018-10-27 ENCOUNTER — Ambulatory Visit (INDEPENDENT_AMBULATORY_CARE_PROVIDER_SITE_OTHER): Payer: Medicare Other | Admitting: Thoracic Surgery (Cardiothoracic Vascular Surgery)

## 2018-10-27 ENCOUNTER — Other Ambulatory Visit: Payer: Self-pay

## 2018-10-27 ENCOUNTER — Encounter: Payer: Self-pay | Admitting: Thoracic Surgery (Cardiothoracic Vascular Surgery)

## 2018-10-27 VITALS — BP 124/64 | HR 50 | Resp 16 | Ht 67.0 in | Wt 154.4 lb

## 2018-10-27 DIAGNOSIS — I712 Thoracic aortic aneurysm, without rupture: Secondary | ICD-10-CM

## 2018-10-27 DIAGNOSIS — I7121 Aneurysm of the ascending aorta, without rupture: Secondary | ICD-10-CM

## 2018-10-27 DIAGNOSIS — I7123 Aneurysm of the descending thoracic aorta, without rupture: Secondary | ICD-10-CM

## 2018-10-27 DIAGNOSIS — Z85118 Personal history of other malignant neoplasm of bronchus and lung: Secondary | ICD-10-CM

## 2018-10-27 NOTE — Progress Notes (Signed)
[address]       [place] [address]             [phone]    HPI: Drew [name] returns for a scheduled follow-up visit  Drew [name] is a 78 year old man with a history of hypertension, abdominal aneurysm status post stent grafting, ascending and descending thoracic aneurysms, tobacco abuse, COPD, and lung cancer status post lingula resection.  He has been followed with CT scans since he had his lung reaction.  In 2014 it was noted that he had a 5 cm ascending aneurysm.  This was present on his scans dating back to 2011.  I saw him in the office in July 2019.  He was doing well at that time.  There was a question of a focal dissection or penetrating ulcer in the descending aorta.  Since his last visit he has been doing well.  Unfortunately, he is having to spend most of his time caring for his wife who has had some health problems.  He continues to have back pain, there has not been any significant change in that.  He says that his COPD has been a little worse in the wintertime.  Reviewing his records he did have a carotid ultrasound in August which showed a 60 to 79% right internal carotid stenosis.  He also had an echocardiogram which showed ejection fraction of 40 to 45%.  There was mild MR and left atrial enlargement.  Past Medical History:  Diagnosis Date  . Aortic aneurysm ([place])   . Barrett's esophagus   . Cancer ([place]) 2011   lung  . COPD (chronic obstructive pulmonary disease) ([place])   . Hydrocele   . Hyperlipidemia   . Hypertension   . Ischemic heart disease   . Peripheral vascular disease ([place])   . Thoracic aortic aneurysm without mention of rupture 2011     Current Outpatient Medications  Medication Sig Dispense Refill  . aspirin 81 MG tablet Take 81 mg by mouth daily.    . budesonide-formoterol (SYMBICORT) 80-4.5 MCG/ACT inhaler Inhale 2 puffs into the lungs 2 (two) times daily.    . clopidogrel (PLAVIX) 75 MG tablet Take 75 mg by mouth  daily.    . DULoxetine (CYMBALTA) 60 MG capsule Take 1 capsule by mouth daily.    . famotidine (PEPCID) 10 MG tablet Take 10 mg by mouth at bedtime.   3  . finasteride (PROSCAR) 5 MG tablet Take 5 mg by mouth daily.    . folic acid (FOLVITE) 1 MG tablet Take 1 mg by mouth daily.    Marland Kitchen gabapentin (NEURONTIN) 300 MG capsule Take 300 mg by mouth 2 (two) times daily. 300 mg in the AM and 600 mg at NIGHT    . losartan (COZAAR) 25 MG tablet Take 1 tablet (25 mg total) by mouth daily.    . metFORMIN (GLUCOPHAGE-XR) 500 MG 24 hr tablet Take 500 mg by mouth 2 (two) times daily.     . metoprolol (TOPROL-XL) 200 MG 24 hr tablet Take 200 mg by mouth daily.    . montelukast (SINGULAIR) 10 MG tablet Take 1 tablet by mouth daily.    . nitroGLYCERIN (NITROSTAT) 0.4 MG SL tablet Place 0.4 mg under the tongue.    . Omega-3 Fatty Acids (OMEGA 3 500 PO) Take 1 capsule by mouth daily.    . pantoprazole (PROTONIX) 40 MG tablet Take 40 mg by mouth daily.    . rosuvastatin (CRESTOR) 20  MG tablet Take 1 tablet (20 mg total) by mouth daily. 90 tablet 3  . tamsulosin (FLOMAX) 0.4 MG CAPS Take 0.4 mg by mouth daily.    . traZODone (DESYREL) 100 MG tablet Take 100 mg by mouth at bedtime.    . vitamin B-12 (CYANOCOBALAMIN) 1000 MCG tablet Take 1,000 mcg by mouth daily.     No current facility-administered medications for this visit.     Physical Exam BP 124/64 (BP Location: Right Arm, Patient Position: Sitting, Cuff Size: Normal) Comment: MANUALLY  Pulse (!) 50   Resp 16   Ht 5\' 7"  (1.702 m)   Wt 154 lb 6.4 oz (70 kg)   SpO2 95% Comment: ON RA  BMI 24.18 kg/m  Drew [name] is a 78 year old gentleman in no acute distress Well-developed and well-nourished Alert and oriented x3 with no focal deficits No carotid bruits Cardiac regular rate and rhythm normal S1 and S2.  2/6 systolic murmur Lungs clear with equal breath sounds bilaterally No peripheral edema  Diagnostic Tests: CT CHEST WITHOUT  CONTRAST  TECHNIQUE: Multidetector CT imaging of the chest was performed following the standard protocol without IV contrast.  COMPARISON:  04/28/2018 chest CT angiogram.  FINDINGS: Cardiovascular: Top-normal heart size. No significant pericardial effusion/thickening. Three-vessel coronary atherosclerosis. Atherosclerotic thoracic aorta with 5.3 cm ascending thoracic aortic aneurysm, previously 5.3 cm using similar measurement technique, stable. Ectatic 3.2 cm common origin of the right brachiocephalic and left common carotid arteries, previously 3.2 cm using similar measurement technique, stable. Aneurysmal descending thoracic aorta measuring up to 4.9 cm diameter, previously 4.9 cm using similar measurement technique, stable. Previously described eccentric focus of wall thickening in the posterior descending thoracic aorta is decreased in thickness, now 6 mm, previously 9 mm. No acute intramural hematoma in the thoracic aorta. Normal caliber pulmonary arteries.  Mediastinum/Nodes: No discrete thyroid nodules. Unremarkable esophagus. No pathologically enlarged axillary, mediastinal or hilar lymph nodes, noting limited sensitivity for the detection of hilar adenopathy on this noncontrast study.  Lungs/Pleura: No pneumothorax. No pleural effusion. Stable postsurgical changes from resection of the lingula. No acute consolidative airspace disease or lung masses. A few scattered tiny solid pulmonary nodules in both lungs measuring up to 3 mm in the left lower lobe (series 8/image 56), all stable since at least 05/06/2017 chest CT and considered benign. No new significant pulmonary nodules.  Upper abdomen: Simple 1.0 cm central liver cyst.  Musculoskeletal: No aggressive appearing focal osseous lesions. Partially visualized surgical hardware from ACDF in cervical spine. Moderate thoracic spondylosis. Aorto bi-iliac stent graft noted on the scout  topogram.  IMPRESSION: 1. Ascending thoracic aortic 5.3 cm aneurysm is stable using similar measurement technique. Ascending thoracic aortic aneurysm. Recommend semi-annual imaging followup by CTA or MRA and referral to cardiothoracic surgery if not already obtained. This recommendation follows 2010 ACCF/AHA/AATS/ACR/ASA/SCA/SCAI/SIR/STS/SVM Guidelines for the Diagnosis and Management of Patients With Thoracic Aortic Disease. Circulation. 2010; 121: U272-Z366. Aortic aneurysm NOS (ICD10-I71.9). 2. No acute intramural hematoma in the thoracic aorta. Previously described ectatic focus of wall thickening in the descending thoracic aorta is decreased. 3. Stable postsurgical changes from resection of the lingula, with no evidence of local tumor recurrence. No findings of metastatic disease in the chest. 4. Three-vessel coronary atherosclerosis.  Aortic Atherosclerosis (ICD10-I70.0).   Electronically Signed   By: Ilona Sorrel M.D.   On: 10/23/2018 11:51 I personally reviewed the CT images and concur with the findings noted above  Impression: Drew [name] is a 78 year old gentleman with a history of hypertension,  dyslipidemia, abdominal aortic aneurysm status post stent graft, ascending and descending thoracic aneurysms, remote tobacco abuse, COPD, and history of lung cancer.  Ascending thoracic aneurysm-stable at 5.3 cm.  Needs continued semiannual follow-up.  New  Descending thoracic aneurysm-stable at 4.9 cm.  Needs continued semiannual follow-up.  Hypertension-blood pressure well controlled on current regimen.  Dyslipidemia-managed by Dr. Agustin Cree.  Lung cancer-no evidence of recurrent  Plan: Return in 6 months with CT of chest.  I think we can again do this without contrast.  Melrose Nakayama, MD [facility] [phone]

## 2018-12-19 DIAGNOSIS — Z79899 Other long term (current) drug therapy: Secondary | ICD-10-CM | POA: Diagnosis not present

## 2018-12-19 DIAGNOSIS — J449 Chronic obstructive pulmonary disease, unspecified: Secondary | ICD-10-CM | POA: Diagnosis not present

## 2018-12-19 DIAGNOSIS — E119 Type 2 diabetes mellitus without complications: Secondary | ICD-10-CM | POA: Diagnosis not present

## 2018-12-19 DIAGNOSIS — R1031 Right lower quadrant pain: Secondary | ICD-10-CM | POA: Diagnosis not present

## 2018-12-19 DIAGNOSIS — R339 Retention of urine, unspecified: Secondary | ICD-10-CM | POA: Diagnosis not present

## 2018-12-19 DIAGNOSIS — I10 Essential (primary) hypertension: Secondary | ICD-10-CM | POA: Diagnosis not present

## 2018-12-19 DIAGNOSIS — R1033 Periumbilical pain: Secondary | ICD-10-CM | POA: Diagnosis not present

## 2018-12-19 DIAGNOSIS — I252 Old myocardial infarction: Secondary | ICD-10-CM | POA: Diagnosis not present

## 2018-12-19 DIAGNOSIS — Z8673 Personal history of transient ischemic attack (TIA), and cerebral infarction without residual deficits: Secondary | ICD-10-CM | POA: Diagnosis not present

## 2018-12-19 DIAGNOSIS — I2510 Atherosclerotic heart disease of native coronary artery without angina pectoris: Secondary | ICD-10-CM | POA: Diagnosis not present

## 2018-12-19 DIAGNOSIS — I714 Abdominal aortic aneurysm, without rupture: Secondary | ICD-10-CM | POA: Diagnosis not present

## 2018-12-19 DIAGNOSIS — R1032 Left lower quadrant pain: Secondary | ICD-10-CM | POA: Diagnosis not present

## 2018-12-19 DIAGNOSIS — Z7984 Long term (current) use of oral hypoglycemic drugs: Secondary | ICD-10-CM | POA: Diagnosis not present

## 2018-12-22 DIAGNOSIS — R338 Other retention of urine: Secondary | ICD-10-CM | POA: Diagnosis not present

## 2018-12-22 DIAGNOSIS — N401 Enlarged prostate with lower urinary tract symptoms: Secondary | ICD-10-CM | POA: Diagnosis not present

## 2018-12-22 DIAGNOSIS — N400 Enlarged prostate without lower urinary tract symptoms: Secondary | ICD-10-CM | POA: Diagnosis not present

## 2018-12-22 DIAGNOSIS — N3000 Acute cystitis without hematuria: Secondary | ICD-10-CM | POA: Diagnosis not present

## 2018-12-22 DIAGNOSIS — R351 Nocturia: Secondary | ICD-10-CM | POA: Diagnosis not present

## 2018-12-23 ENCOUNTER — Telehealth: Payer: Self-pay

## 2018-12-23 NOTE — Telephone Encounter (Signed)
Left voicemail for patient to call the office about his up coming surgery with Dr. Emi Holes.

## 2018-12-24 NOTE — Telephone Encounter (Signed)
Spoke with patient's wife, Bethena Roys and let her know that Dave needed to have a Occupational psychologist Stress Test Prior to surgery. We discussed having him set up at [facility] since it would be faster and closer to where they lived. She was in agreeable with this. I advised that we would call back with Date and time and all directions.

## 2018-12-24 NOTE — Telephone Encounter (Signed)
Please call patient back

## 2018-12-28 DIAGNOSIS — N318 Other neuromuscular dysfunction of bladder: Secondary | ICD-10-CM | POA: Diagnosis not present

## 2018-12-28 DIAGNOSIS — N401 Enlarged prostate with lower urinary tract symptoms: Secondary | ICD-10-CM | POA: Diagnosis not present

## 2018-12-28 DIAGNOSIS — R351 Nocturia: Secondary | ICD-10-CM | POA: Diagnosis not present

## 2018-12-28 DIAGNOSIS — N3020 Other chronic cystitis without hematuria: Secondary | ICD-10-CM | POA: Diagnosis not present

## 2018-12-29 ENCOUNTER — Encounter: Payer: Self-pay | Admitting: Cardiology

## 2018-12-29 DIAGNOSIS — I2510 Atherosclerotic heart disease of native coronary artery without angina pectoris: Secondary | ICD-10-CM | POA: Diagnosis not present

## 2018-12-29 DIAGNOSIS — R079 Chest pain, unspecified: Secondary | ICD-10-CM | POA: Diagnosis not present

## 2019-01-01 ENCOUNTER — Telehealth: Payer: Self-pay | Admitting: Cardiology

## 2019-01-01 NOTE — Telephone Encounter (Signed)
[id number]

## 2019-01-04 ENCOUNTER — Telehealth: Payer: Self-pay | Admitting: Cardiology

## 2019-01-04 NOTE — Telephone Encounter (Signed)
[id number] Carotid

## 2019-01-25 NOTE — Telephone Encounter (Signed)
Left voicemail for patient to call to get worked back on to the schedule for a televisit.

## 2019-01-27 NOTE — Telephone Encounter (Signed)
Recall for Carotid entered

## 2019-02-24 ENCOUNTER — Other Ambulatory Visit: Payer: Self-pay | Admitting: *Deleted

## 2019-02-24 DIAGNOSIS — I712 Thoracic aortic aneurysm, without rupture, unspecified: Secondary | ICD-10-CM

## 2019-02-24 NOTE — Progress Notes (Unsigned)
ct 

## 2019-05-19 DIAGNOSIS — K219 Gastro-esophageal reflux disease without esophagitis: Secondary | ICD-10-CM | POA: Diagnosis not present

## 2019-05-19 DIAGNOSIS — R1310 Dysphagia, unspecified: Secondary | ICD-10-CM | POA: Diagnosis not present

## 2019-05-20 ENCOUNTER — Ambulatory Visit
Admission: RE | Admit: 2019-05-20 | Discharge: 2019-05-20 | Disposition: A | Discharge status: Home / Self Care | Payer: Medicare Other | Source: Ambulatory Visit | Attending: Thoracic Surgery (Cardiothoracic Vascular Surgery) | Admitting: Thoracic Surgery (Cardiothoracic Vascular Surgery)

## 2019-05-20 DIAGNOSIS — R918 Other nonspecific abnormal finding of lung field: Secondary | ICD-10-CM | POA: Diagnosis not present

## 2019-05-20 DIAGNOSIS — I712 Thoracic aortic aneurysm, without rupture, unspecified: Secondary | ICD-10-CM

## 2019-05-25 ENCOUNTER — Encounter: Payer: Self-pay | Admitting: Thoracic Surgery (Cardiothoracic Vascular Surgery)

## 2019-05-25 ENCOUNTER — Other Ambulatory Visit: Payer: Self-pay

## 2019-05-25 ENCOUNTER — Ambulatory Visit (INDEPENDENT_AMBULATORY_CARE_PROVIDER_SITE_OTHER): Payer: Medicare Other | Admitting: Thoracic Surgery (Cardiothoracic Vascular Surgery)

## 2019-05-25 VITALS — BP 140/80 | HR 60 | Temp 97.7°F | Resp 20 | Ht 67.0 in | Wt 154.0 lb

## 2019-05-25 DIAGNOSIS — I7121 Aneurysm of the ascending aorta, without rupture: Secondary | ICD-10-CM

## 2019-05-25 DIAGNOSIS — I7123 Aneurysm of the descending thoracic aorta, without rupture: Secondary | ICD-10-CM

## 2019-05-25 DIAGNOSIS — Z85118 Personal history of other malignant neoplasm of bronchus and lung: Secondary | ICD-10-CM | POA: Diagnosis not present

## 2019-05-25 DIAGNOSIS — I712 Thoracic aortic aneurysm, without rupture: Secondary | ICD-10-CM

## 2019-05-25 NOTE — Progress Notes (Signed)
[address]       [place] [address]             [phone]     HPI: Drew [name] returns for a scheduled follow-up visit  Drew [name] is a 78 year old man with a history of hypertension, stent graft for abdominal aortic aneurysm, ascending and descending thoracic aortic aneurysms, thoracic aortic atherosclerosis, tobacco abuse (quit 30 years ago), COPD, and a lingula resection for lung cancer in 2011.  He was first noted to have a 5 cm ascending aneurysm in 2014.  In retrospect it was present on CTs dating back to 2011.  We have been following him on a regular basis since 2014.  Last saw him in the office in January 2020.  He was doing well at that time with no new medical issues.  In the interim since his last visit he has been doing fairly well.  His COPD has been aggravated by the humidity over the past month or so.  He does cough up clear mucus.  He is not having any chest pain.   Past Medical History:  Diagnosis Date  . Aortic aneurysm ([place])   . Barrett's esophagus   . Cancer ([place]) 2011   lung  . COPD (chronic obstructive pulmonary disease) ([place])   . Hydrocele   . Hyperlipidemia   . Hypertension   . Ischemic heart disease   . Peripheral vascular disease ([place])   . 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 aspirin 81 MG tablet Take 81 mg by mouth daily.    . budesonide-formoterol (SYMBICORT) 80-4.5 MCG/ACT inhaler Inhale 2 puffs into the lungs 2 (two) times daily.    . clopidogrel (PLAVIX) 75 MG tablet Take 75 mg by mouth daily.    . famotidine (PEPCID) 10 MG tablet Take 10 mg by mouth at bedtime.   3  . finasteride (PROSCAR) 5 MG tablet Take 5 mg by mouth daily.    . folic acid (FOLVITE) 1 MG tablet Take 1 mg by mouth daily.    Marland Kitchen gabapentin (NEURONTIN) 300 MG capsule Take 300 mg by mouth 2 (two) times daily. 300 mg in the AM and 600 mg at NIGHT    .  losartan (COZAAR) 25 MG tablet Take 1 tablet (25 mg total) by mouth daily.    . metFORMIN (GLUCOPHAGE-XR) 500 MG 24 hr tablet Take 500 mg by mouth 2 (two) times daily.     . metoprolol (TOPROL-XL) 200 MG 24 hr tablet Take 200 mg by mouth daily.    . montelukast (SINGULAIR) 10 MG tablet Take 1 tablet by mouth daily.    . nitroGLYCERIN (NITROSTAT) 0.4 MG SL tablet Place 0.4 mg under the tongue.    . Omega-3 Fatty Acids (OMEGA 3 500 PO) Take 1 capsule by mouth daily.    . pantoprazole (PROTONIX) 40 MG tablet Take 40 mg by mouth daily.    . rosuvastatin (CRESTOR) 20 MG tablet Take 1 tablet (20 mg total) by mouth daily. 90 tablet 3  . tamsulosin (FLOMAX) 0.4 MG CAPS Take 0.4 mg by mouth daily.    . traZODone (DESYREL) 100 MG tablet Take 100 mg by mouth at bedtime.     No current facility-administered medications for this visit.     Physical Exam BP 140/80   Pulse 60   Temp 97.7 F (36.5 C) (Skin)   Resp 20  Ht 5\' 7"  (1.702 m)   Wt 154 lb (69.9 kg)   SpO2 93%   BMI 24.38 kg/m  78 year old man in no acute distress Alert and oriented x3 with no focal deficits No carotid bruits or cervical adenopathy Cardiac regular rate and rhythm with 2/6 systolic murmur Lungs diminished but otherwise clear bilaterally, no wheezing No peripheral edema  Diagnostic Tests: CT CHEST WITHOUT CONTRAST  TECHNIQUE: Multidetector CT imaging of the chest was performed following the standard protocol without IV contrast.  COMPARISON:  10/23/2018  FINDINGS: Cardiovascular: The heart size is mildly enlarged. Aortic atherosclerosis and 3 vessel coronary artery calcifications. Enlargement of the thoracic aorta is again noted.  -Ascending thoracic aorta measures 5.3 cm, image 56/4.  -proximal arch measures 4.7 cm, image 46/2.  Unchanged  -Distal arch measures 3.8 cm, unchanged.  -descending thoracic aorta measures 4.9 cm maximum, unchanged.  -At the level of the hiatus the aorta measures 3.4  cm.  Mediastinum/Nodes: No enlarged mediastinal or axillary lymph nodes. Thyroid gland, trachea, and esophagus demonstrate no significant findings.  Lungs/Pleura: No pleural effusion identified. Postsurgical changes of the lingula are again noted. Stable. A few small scattered less than 5 mm lung nodules are again identified. The largest is in the posteromedial left lower lobe with equivocal in diameter of 3.5 mm. These are all stable when compared with previous exam.  Upper Abdomen: Unchanged low-density structure in the central liver measuring 8 mm. Too small to characterize. Favor benign cysts. No acute findings identified. Stent graft repair of abdominal aorta.  Musculoskeletal: Spondylosis throughout the thoracic spine. No acute or significant osseous finding.  IMPRESSION: 1. Stable exam. Unchanged aneurysmal dilatation of the thoracic aorta. The ascending thoracic aorta measures 5.3 cm. Ascending thoracic aortic aneurysm. Recommend semi-annual imaging followup by CTA or MRA and referral to cardiothoracic surgery if not already obtained. This recommendation follows 2010 ACCF/AHA/AATS/ACR/ASA/SCA/SCAI/SIR/STS/SVM Guidelines for the Diagnosis and Management of Patients With Thoracic Aortic Disease. Circulation. 2010; 121: T625-W389. Aortic aneurysm NOS (ICD10-I71.9) 2. Stable postsurgical changes from resection of the lingula. No specific findings to suggest local tumor recurrence or metastatic disease. 3. Aortic Atherosclerosis (ICD10-I70.0). Coronary artery calcifications.   Electronically Signed   By: Kerby Moors M.D.   On: 05/20/2019 09:33 I personally reviewed the CT images and concur with the findings noted above  Impression: Drew [name] is a 77 year old man with a past medical history of hypertension, stent graft for abdominal aortic aneurysm, ascending and descending thoracic aortic aneurysms, thoracic aortic atherosclerosis, tobacco abuse (quit 30  years ago), COPD, and a lingula resection for lung cancer in 2011.  Aortic atherosclerosis, ascending and descending thoracic aneurysms- stable at 5.3 and 4.9 cm respectively.  No indication for surgery at this time.  Needs continued semiannual follow-up.  History of lung cancer-has small stable lung nodules bilaterally.  No evidence of recurrent disease.  Continue to follow with CTs  Hypertension-blood pressure at upper limit of normal.  Continue current medications  Plan: Return in 6 months with CT of chest  Melrose Nakayama, MD [facility] [phone]

## 2019-06-09 ENCOUNTER — Other Ambulatory Visit: Payer: Self-pay

## 2019-06-09 ENCOUNTER — Encounter (INDEPENDENT_AMBULATORY_CARE_PROVIDER_SITE_OTHER): Payer: Medicare Other

## 2019-06-09 DIAGNOSIS — I2510 Atherosclerotic heart disease of native coronary artery without angina pectoris: Secondary | ICD-10-CM

## 2019-06-09 NOTE — Progress Notes (Addendum)
Carotid duplex exam has been performed. Right ICA stenosis is  60-79% stenosed.  Jimmy Jaimeson Gopal RDCS, RVT

## 2019-06-10 ENCOUNTER — Telehealth: Payer: Self-pay | Admitting: *Deleted

## 2019-06-10 NOTE — Telephone Encounter (Signed)
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

## 2019-06-10 NOTE — Telephone Encounter (Signed)
Telephone call to patient. Left message per DPR of carotid ultrasound results and to call with any questions.

## 2019-08-05 DIAGNOSIS — Z23 Encounter for immunization: Secondary | ICD-10-CM | POA: Diagnosis not present

## 2019-10-26 ENCOUNTER — Other Ambulatory Visit: Payer: Self-pay | Admitting: Thoracic Surgery (Cardiothoracic Vascular Surgery)

## 2019-10-26 DIAGNOSIS — I712 Thoracic aortic aneurysm, without rupture, unspecified: Secondary | ICD-10-CM

## 2019-11-25 ENCOUNTER — Other Ambulatory Visit: Payer: Medicare Other

## 2019-11-26 ENCOUNTER — Ambulatory Visit
Admission: RE | Admit: 2019-11-26 | Discharge: 2019-11-26 | Disposition: A | Discharge status: Home / Self Care | Payer: Medicare Other | Source: Ambulatory Visit | Attending: Thoracic Surgery (Cardiothoracic Vascular Surgery) | Admitting: Thoracic Surgery (Cardiothoracic Vascular Surgery)

## 2019-11-26 DIAGNOSIS — I712 Thoracic aortic aneurysm, without rupture, unspecified: Secondary | ICD-10-CM

## 2019-11-26 DIAGNOSIS — R918 Other nonspecific abnormal finding of lung field: Secondary | ICD-10-CM | POA: Diagnosis not present

## 2019-11-30 ENCOUNTER — Ambulatory Visit: Payer: Medicare Other | Admitting: Thoracic Surgery (Cardiothoracic Vascular Surgery)

## 2019-11-30 NOTE — Progress Notes (Unsigned)
This encounter was created in error - please disregard.

## 2019-12-14 ENCOUNTER — Other Ambulatory Visit: Payer: Self-pay

## 2019-12-14 ENCOUNTER — Encounter: Payer: Self-pay | Admitting: Thoracic Surgery (Cardiothoracic Vascular Surgery)

## 2019-12-14 ENCOUNTER — Ambulatory Visit (INDEPENDENT_AMBULATORY_CARE_PROVIDER_SITE_OTHER): Payer: Medicare Other | Admitting: Thoracic Surgery (Cardiothoracic Vascular Surgery)

## 2019-12-14 VITALS — BP 149/74 | HR 66 | Temp 97.6°F | Resp 20 | Ht 67.0 in | Wt 155.0 lb

## 2019-12-14 DIAGNOSIS — I712 Thoracic aortic aneurysm, without rupture, unspecified: Secondary | ICD-10-CM

## 2019-12-14 NOTE — Progress Notes (Signed)
[address]       [place] [address]             [phone]       HPI: Mr. Stuckey returns for a scheduled follow-up visit  Bari Handshoe is a 79 year old man with a history of hypertension, abdominal aortic aneurysm status post stent graft, thoracic aortic atherosclerosis, ascending and descending thoracic aortic aneurysms, remote tobacco abuse (quit 30 years ago), COPD, and a lingular resection for lung cancer in 2011.  He was noted to have a 5 cm ascending aneurysm in 2014.  In retrospect it was present on his scans back in 2011.  He has been followed on a regular basis.  I last saw him in the office in August 2020.  His aneurysms measured 5.3 cm ascending and 4.9 cm descending.  He feels well.  He is not having any chest pain, pressure, or tightness.  He does have shortness of breath with exertion from his COPD.  That is unchanged.  Past Medical History:  Diagnosis Date  . Aortic aneurysm ([place])   . Barrett's esophagus   . Cancer ([place]) 2011   lung  . COPD (chronic obstructive pulmonary disease) ([place])   . Hydrocele   . Hyperlipidemia   . Hypertension   . Ischemic heart disease   . Peripheral vascular disease ([place])   . Thoracic aortic aneurysm without mention of rupture 2011     Current Outpatient Medications  Medication Sig Dispense Refill  . aspirin 81 MG tablet Take 81 mg by mouth daily.    . budesonide-formoterol (SYMBICORT) 80-4.5 MCG/ACT inhaler Inhale 2 puffs into the lungs 2 (two) times daily.    . clopidogrel (PLAVIX) 75 MG tablet Take 75 mg by mouth daily.    . famotidine (PEPCID) 10 MG tablet Take 10 mg by mouth at bedtime.   3  . finasteride (PROSCAR) 5 MG tablet Take 5 mg by mouth daily.    . folic acid (FOLVITE) 1 MG tablet Take 1 mg by mouth daily.    Marland Kitchen gabapentin (NEURONTIN) 300 MG capsule Take 300 mg by mouth 2 (two) times daily. 300 mg in the AM and 600 mg at NIGHT    . losartan (COZAAR) 25 MG tablet Take 1 tablet (25 mg  total) by mouth daily.    . metFORMIN (GLUCOPHAGE-XR) 500 MG 24 hr tablet Take 500 mg by mouth 2 (two) times daily.     . metoprolol (TOPROL-XL) 200 MG 24 hr tablet Take 200 mg by mouth daily.    . montelukast (SINGULAIR) 10 MG tablet Take 1 tablet by mouth daily.    . nitroGLYCERIN (NITROSTAT) 0.4 MG SL tablet Place 0.4 mg under the tongue.    . Omega-3 Fatty Acids (OMEGA 3 500 PO) Take 1 capsule by mouth daily.    . pantoprazole (PROTONIX) 40 MG tablet Take 40 mg by mouth daily.    . rosuvastatin (CRESTOR) 20 MG tablet Take 1 tablet (20 mg total) by mouth daily. 90 tablet 3  . tamsulosin (FLOMAX) 0.4 MG CAPS Take 0.4 mg by mouth daily.    . traZODone (DESYREL) 100 MG tablet Take 100 mg by mouth at bedtime.     No current facility-administered medications for this visit.    Physical Exam BP (!) 149/74   Pulse 66   Temp 97.6 F (36.4 C) (Skin)   Resp 20   Ht 5\' 7"  (1.702 m)   Wt 155 lb (70.3 kg)  SpO2 96% Comment: RA  BMI 24.73 kg/m  79 year old man in no acute distress Alert and oriented x3 with no focal deficits Lungs diminished breath sounds bilaterally, no wheezing Cardiac regular rate and rhythm with a 2/6 systolic murmur No carotid bruits No peripheral edema  Diagnostic Tests: CT CHEST WITHOUT CONTRAST  TECHNIQUE: Multidetector CT imaging of the chest was performed following the standard protocol without IV contrast.  COMPARISON:  05/20/2019  FINDINGS: Cardiovascular: Mild cardiac enlargement. Three vessel coronary artery calcifications. Enlargement of the thoracic aorta is again noted:  -the ascending thoracic aorta Measures 5.2 cm, image 59/4. Unchanged.  -the proximal aortic arch measures 4.7 cm, image 43/2. Unchanged.  -the distal arch measures 3.7 cm, image 110/6. Unchanged.  -proximal descending thoracic aorta measures 4.9 cm, image 113/6.  -at the level of the hiatus the aorta measures 3.5 cm, image 108/6. Stable.  Mediastinum/Nodes:  Normal appearance of the thyroid gland.  The trachea appears patent and is midline. Normal appearance of the esophagus. No thoracic adenopathy identified.  Lungs/Pleura: No pleural effusion identified. Postsurgical changes within the lingula are again noted. 4 mm left lower lobe lung nodule is unchanged, image 51/8 stable small subpleural nodules in the right middle lobe. No new suspicious lung nodules.  Upper Abdomen: Unchanged cyst in central liver measuring 8 mm, image 126/2. No acute abnormality noted within the upper abdomen. Previous stent graft repair of infrarenal abdominal aorta.  Musculoskeletal: Thoracic degenerative disc disease. No acute or suspicious bone lesions.  IMPRESSION: 1. Stable aneurysmal dilatation of the thoracic aorta. This measures 5.2 cm in maximum dimension. Recommend semi-annual imaging followup by CTA or MRA and referral to cardiothoracic surgery if not already obtained. This recommendation follows 2010 ACCF/AHA/AATS/ACR/ASA/SCA/SCAI/SIR/STS/SVM Guidelines for the Diagnosis and Management of Patients With Thoracic Aortic Disease. Circulation. 2010; 121: E266-e369TAA. Aortic aneurysm NOS (ICD10-I71.9) 2. Three vessel coronary artery calcifications. 3. Small milli metric lung nodules are unchanged from previous exam. 4. Aortic Atherosclerosis (ICD10-I70.0). Coronary artery calcifications.   Electronically Signed   By: Kerby Moors M.D.   On: 11/26/2019 13:13 I personally reviewed the CT images and concur with the findings noted above  Impression: Drew [name] is a 79 year old man with a history of hypertension, abdominal aortic aneurysm status post stent graft, thoracic aortic atherosclerosis, ascending and descending thoracic aortic aneurysms, remote tobacco abuse (quit 30 years ago), COPD, and a lingular resection for lung cancer in 2011.  Thoracic aortic atherosclerosis with ascending and descending aneurysms- stable at 5.3 and 4.9  cm respectively.  Needs continued semiannual follow-up.  We have been able to get good measurements with noncontrasted scan so we will continue that for now.  Hypertension- blood pressure elevated today with systolic 465.  He has a cuff at home but has not been using it.  He was right around 140 the last time I saw him.  He is on a high dose of metoprolol.  I recommended he check his blood pressure at different times of the day several times a week and if he is seeing systolic pressures greater than 130 he should let us know so we can adjust his medications.  Lung cancer-lingula resection in 2011.  No evidence of recurrent disease.  Plan: Monitor blood pressure at home.  Goal is to maintain systolic blood pressure less than 130 ideally in definitely less than 140. Return in 6 months with CT of chest   Melrose Nakayama, MD [facility] [phone]

## 2020-02-16 DIAGNOSIS — M79641 Pain in right hand: Secondary | ICD-10-CM | POA: Diagnosis not present

## 2020-02-16 DIAGNOSIS — N529 Male erectile dysfunction, unspecified: Secondary | ICD-10-CM | POA: Diagnosis not present

## 2020-02-16 DIAGNOSIS — Z79899 Other long term (current) drug therapy: Secondary | ICD-10-CM | POA: Diagnosis not present

## 2020-02-16 DIAGNOSIS — I2510 Atherosclerotic heart disease of native coronary artery without angina pectoris: Secondary | ICD-10-CM | POA: Diagnosis not present

## 2020-02-16 DIAGNOSIS — D519 Vitamin B12 deficiency anemia, unspecified: Secondary | ICD-10-CM | POA: Diagnosis not present

## 2020-02-16 DIAGNOSIS — Z6823 Body mass index (BMI) 23.0-23.9, adult: Secondary | ICD-10-CM | POA: Diagnosis not present

## 2020-02-16 DIAGNOSIS — E7800 Pure hypercholesterolemia, unspecified: Secondary | ICD-10-CM | POA: Diagnosis not present

## 2020-03-23 DIAGNOSIS — M65331 Trigger finger, right middle finger: Secondary | ICD-10-CM

## 2020-03-23 DIAGNOSIS — M79641 Pain in right hand: Secondary | ICD-10-CM

## 2020-03-23 HISTORY — DX: Pain in right hand: M79.641

## 2020-03-23 HISTORY — DX: Trigger finger, right middle finger: M65.331

## 2020-03-27 DIAGNOSIS — R21 Rash and other nonspecific skin eruption: Secondary | ICD-10-CM | POA: Diagnosis not present

## 2020-05-15 DIAGNOSIS — I10 Essential (primary) hypertension: Secondary | ICD-10-CM | POA: Diagnosis not present

## 2020-05-15 DIAGNOSIS — Z0000 Encounter for general adult medical examination without abnormal findings: Secondary | ICD-10-CM | POA: Diagnosis not present

## 2020-05-15 DIAGNOSIS — Z79899 Other long term (current) drug therapy: Secondary | ICD-10-CM | POA: Diagnosis not present

## 2020-05-15 DIAGNOSIS — Z6824 Body mass index (BMI) 24.0-24.9, adult: Secondary | ICD-10-CM | POA: Diagnosis not present

## 2020-05-15 DIAGNOSIS — Z1331 Encounter for screening for depression: Secondary | ICD-10-CM | POA: Diagnosis not present

## 2020-05-15 DIAGNOSIS — I259 Chronic ischemic heart disease, unspecified: Secondary | ICD-10-CM | POA: Diagnosis not present

## 2020-05-15 DIAGNOSIS — E7800 Pure hypercholesterolemia, unspecified: Secondary | ICD-10-CM | POA: Diagnosis not present

## 2020-05-31 DIAGNOSIS — K2270 Barrett's esophagus without dysplasia: Secondary | ICD-10-CM | POA: Diagnosis not present

## 2020-05-31 DIAGNOSIS — K5732 Diverticulitis of large intestine without perforation or abscess without bleeding: Secondary | ICD-10-CM | POA: Diagnosis not present

## 2020-05-31 DIAGNOSIS — K219 Gastro-esophageal reflux disease without esophagitis: Secondary | ICD-10-CM | POA: Diagnosis not present

## 2020-06-13 ENCOUNTER — Other Ambulatory Visit: Payer: Self-pay | Admitting: *Deleted

## 2020-06-13 DIAGNOSIS — I712 Thoracic aortic aneurysm, without rupture, unspecified: Secondary | ICD-10-CM

## 2020-07-06 ENCOUNTER — Ambulatory Visit
Admission: RE | Admit: 2020-07-06 | Discharge: 2020-07-06 | Disposition: A | Discharge status: Home / Self Care | Payer: Medicare Other | Source: Ambulatory Visit | Attending: Thoracic Surgery (Cardiothoracic Vascular Surgery) | Admitting: Thoracic Surgery (Cardiothoracic Vascular Surgery)

## 2020-07-06 DIAGNOSIS — I712 Thoracic aortic aneurysm, without rupture, unspecified: Secondary | ICD-10-CM

## 2020-07-06 DIAGNOSIS — I517 Cardiomegaly: Secondary | ICD-10-CM | POA: Diagnosis not present

## 2020-07-06 DIAGNOSIS — I2510 Atherosclerotic heart disease of native coronary artery without angina pectoris: Secondary | ICD-10-CM | POA: Diagnosis not present

## 2020-07-06 DIAGNOSIS — M47814 Spondylosis without myelopathy or radiculopathy, thoracic region: Secondary | ICD-10-CM | POA: Diagnosis not present

## 2020-07-11 ENCOUNTER — Other Ambulatory Visit: Payer: Self-pay

## 2020-07-11 ENCOUNTER — Ambulatory Visit (INDEPENDENT_AMBULATORY_CARE_PROVIDER_SITE_OTHER): Payer: Medicare Other | Admitting: Thoracic Surgery (Cardiothoracic Vascular Surgery)

## 2020-07-11 ENCOUNTER — Other Ambulatory Visit: Payer: Medicare Other

## 2020-07-11 VITALS — BP 177/84 | HR 68 | Temp 98.4°F | Resp 20 | Ht 67.0 in | Wt 150.0 lb

## 2020-07-11 DIAGNOSIS — I712 Thoracic aortic aneurysm, without rupture, unspecified: Secondary | ICD-10-CM

## 2020-07-11 MED ORDER — LOSARTAN POTASSIUM 50 MG PO TABS: 50.0000 mg | ORAL_TABLET | Freq: Every day | ORAL | 6 refills | Status: DC

## 2020-07-11 NOTE — Progress Notes (Signed)
[address]       [place] [address]             [phone]    HPI: Drew [name] returns for follow-up of his ascending and descending thoracic aortic aneurysms  Drew [name] is a 79 year old man with a history of hypertension, AAA status post stent graft, thoracic aortic atherosclerosis, ascending and descending thoracic aortic aneurysms, remote tobacco abuse, COPD, lingular resection for lung cancer (2011).  He was first found to have a 5 cm ascending aneurysm in 2014.  He also had a descending thoracic aneurysm in the setting of severe generalized thoracic aortic atherosclerosis.  I been following him in the office since then.  His most recent office visit was March of this year.  His aneurysm was measured 5.3 and 4.9 cm respectively.  Recently he has been feeling well.  However, he has been under some stress due to his wife fracturing her leg and being in rehab.  He has not been checking his blood pressure at home recently.  Past Medical History:  Diagnosis Date  . Aortic aneurysm ([place])   . Barrett's esophagus   . Cancer ([place]) 2011   lung  . COPD (chronic obstructive pulmonary disease) ([place])   . Hydrocele   . Hyperlipidemia   . Hypertension   . Ischemic heart disease   . Peripheral vascular disease ([place])   . 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 aspirin 81 MG tablet Take 81 mg by mouth daily.    . budesonide-formoterol (SYMBICORT) 80-4.5 MCG/ACT inhaler Inhale 2 puffs into the lungs 2 (two) times daily.    . clopidogrel (PLAVIX) 75 MG tablet Take 75 mg by mouth daily.    . famotidine (PEPCID) 10 MG tablet Take 10 mg by mouth at bedtime.   3  . finasteride (PROSCAR) 5 MG tablet Take 5 mg by mouth daily.    Marland Kitchen losartan (COZAAR) 50 MG tablet Take 1 tablet (50 mg total) by mouth daily. 30 tablet 6  . metFORMIN (GLUCOPHAGE-XR) 500 MG 24 hr tablet Take 500 mg by  mouth 2 (two) times daily.     . metoprolol (TOPROL-XL) 200 MG 24 hr tablet Take 200 mg by mouth daily.    . montelukast (SINGULAIR) 10 MG tablet Take 1 tablet by mouth daily.    . nitroGLYCERIN (NITROSTAT) 0.4 MG SL tablet Place 0.4 mg under the tongue.    . Omega-3 Fatty Acids (OMEGA 3 500 PO) Take 1 capsule by mouth daily.    . pantoprazole (PROTONIX) 40 MG tablet Take 40 mg by mouth daily.    . tamsulosin (FLOMAX) 0.4 MG CAPS Take 0.4 mg by mouth daily.    . traZODone (DESYREL) 100 MG tablet Take 100 mg by mouth at bedtime.    . folic acid (FOLVITE) 1 MG tablet Take 1 mg by mouth daily. (Patient not taking: Reported on 07/11/2020)    . gabapentin (NEURONTIN) 300 MG capsule Take 300 mg by mouth 2 (two) times daily. 300 mg in the AM and 600 mg at NIGHT (Patient not taking: Reported on 07/11/2020)    . rosuvastatin (CRESTOR) 20 MG tablet Take 1 tablet (20 mg total) by mouth daily. 90 tablet 3   No current facility-administered medications for this visit.    Physical Exam BP (!) 177/84   Pulse 68   Temp 98.4 F (36.9 C) (Skin)  Resp 20   Ht 5\' 7"  (1.702 m)   Wt 150 lb (68 kg)   SpO2 95% Comment: RA  BMI 23.46 kg/m  79 year old man in no acute distress Well-developed and well-nourished Alert and oriented x3 with no focal motor deficits No carotid bruits Cardiac regular rate and rhythm with normal S1 and S2, faint systolic murmur. Lungs clear with equal breath sounds bilaterally No peripheral edema  Diagnostic Tests: CT CHEST WITHOUT CONTRAST  TECHNIQUE: Multidetector CT imaging of the chest was performed following the standard protocol without IV contrast.  COMPARISON:  11/26/2019  FINDINGS: Cardiovascular: Heart size is mildly enlarged. No pericardial effusion. Prominent coronary artery calcifications. Main pulmonary trunk is normal in caliber. Thoracic aortic aneurysm is again identified with measurements as follows:  *Ascending thoracic aorta measures 5.5 cm  (series 4, image 75), previously 5.2 cm. *Proximal aortic arch measures 4.7 cm (series 2, image 40), unchanged. *Distal aortic arch measures 3.7 cm (series 6, image 109), unchanged. *Proximal descending thoracic aorta measures 5.0 cm (series 6, image 111), previously 4.9 cm. *Aortic hiatus level measures 3.6 cm (series 6, image 101), previously 3.5 cm.  Mediastinum/Nodes: No thyroid nodule. No axillary, mediastinal, or hilar lymphadenopathy. Trachea and esophagus within normal limits.  Lungs/Pleura: Stable benign 4 mm pulmonary nodule within the left lower lobe (series 8, image 48). Stable small subpleural nodules anteriorly within the right middle lobe and inferior right upper lobe. No new pulmonary nodule. No focal consolidation, pleural effusion, or pneumothorax.  Upper [place]  Partially visualized abdominal aortic stent graft. No new or acute findings within the visualized abdomen.  Musculoskeletal: Degenerative changes within the thoracic spine. No new or acute osseous findings. No chest wall abnormality.  IMPRESSION: Slight interval increase of ascending thoracic aortic aneurysm now measuring up to 5.5 cm, previously 5.2 cm. Recommend semi-annual imaging followup by CTA or MRA and referral to cardiothoracic surgery if not already obtained. This recommendation follows 2010 ACCF/AHA/AATS/ACR/ASA/SCA/SCAI/SIR/STS/SVM Guidelines for the Diagnosis and Management of Patients With Thoracic Aortic Disease. Circulation. 2010; 121: [id number]. Aortic aneurysm NOS (ICD10-I71.9)   Electronically Signed   By: Davina Poke D.O.   On: 07/06/2020 11:15 I personally reviewed the CT images.  I get measurements of 5.3 to 5.4 cm in the ascending aneurysm, similar to his previous scan.  Descending does measure 5 cm.  Impression: Drew [name] is a 79 year old man with a history of hypertension, AAA status post stent graft, thoracic aortic atherosclerosis, ascending and  descending thoracic aortic aneurysms, remote tobacco abuse, COPD, lingular resection for lung cancer (2011).  Thoracic aortic atherosclerosis with ascending and descending aneurysms-both aneurysms probably minimally enlarged.  No indication for surgery at this point, but is very close to needing to make a decision.  We will plan to repeat a scan in 6 months and use contrast to get a more accurate measurement since he is borderline.  Hypertension-importance of blood pressure control was emphasized.  He is on 200 mg daily of Toprol-XL but only a very small dose of Cozaar.  I am going to increase the Cozaar to 50 mg daily.  I emphasized the importance of monitoring his blood pressure at home and letting us know if his systolic is above 009.  Lung cancer-status post lingular resection in 2011.  No evidence of recurrence.  Plan: Increase Cozaar to 50 mg daily Monitor blood pressure and contact MD if systolic greater than 381 Return in 6 months with CT angio of chest  Melrose Nakayama, MD [facility] [phone]

## 2020-07-19 DIAGNOSIS — R0789 Other chest pain: Secondary | ICD-10-CM | POA: Diagnosis not present

## 2020-07-19 DIAGNOSIS — Z23 Encounter for immunization: Secondary | ICD-10-CM | POA: Diagnosis not present

## 2020-07-19 DIAGNOSIS — Z6823 Body mass index (BMI) 23.0-23.9, adult: Secondary | ICD-10-CM | POA: Diagnosis not present

## 2020-07-19 DIAGNOSIS — R1084 Generalized abdominal pain: Secondary | ICD-10-CM | POA: Diagnosis not present

## 2020-09-13 ENCOUNTER — Other Ambulatory Visit: Payer: Self-pay

## 2020-09-13 DIAGNOSIS — K219 Gastro-esophageal reflux disease without esophagitis: Secondary | ICD-10-CM | POA: Insufficient documentation

## 2020-09-13 DIAGNOSIS — N433 Hydrocele, unspecified: Secondary | ICD-10-CM | POA: Insufficient documentation

## 2020-09-13 DIAGNOSIS — J449 Chronic obstructive pulmonary disease, unspecified: Secondary | ICD-10-CM | POA: Insufficient documentation

## 2020-09-13 DIAGNOSIS — E785 Hyperlipidemia, unspecified: Secondary | ICD-10-CM | POA: Insufficient documentation

## 2020-09-13 DIAGNOSIS — I719 Aortic aneurysm of unspecified site, without rupture: Secondary | ICD-10-CM | POA: Insufficient documentation

## 2020-09-13 DIAGNOSIS — K2270 Barrett's esophagus without dysplasia: Secondary | ICD-10-CM | POA: Insufficient documentation

## 2020-09-13 DIAGNOSIS — I259 Chronic ischemic heart disease, unspecified: Secondary | ICD-10-CM | POA: Insufficient documentation

## 2020-09-15 ENCOUNTER — Ambulatory Visit (INDEPENDENT_AMBULATORY_CARE_PROVIDER_SITE_OTHER): Payer: Medicare Other | Admitting: Cardiology

## 2020-09-15 ENCOUNTER — Telehealth (HOSPITAL_COMMUNITY): Payer: Self-pay | Admitting: *Deleted

## 2020-09-15 ENCOUNTER — Other Ambulatory Visit: Payer: Self-pay

## 2020-09-15 ENCOUNTER — Encounter: Payer: Self-pay | Admitting: Cardiology

## 2020-09-15 VITALS — BP 128/70 | HR 48 | Ht 67.0 in | Wt 148.0 lb

## 2020-09-15 DIAGNOSIS — I10 Essential (primary) hypertension: Secondary | ICD-10-CM

## 2020-09-15 DIAGNOSIS — I712 Thoracic aortic aneurysm, without rupture, unspecified: Secondary | ICD-10-CM

## 2020-09-15 DIAGNOSIS — I259 Chronic ischemic heart disease, unspecified: Secondary | ICD-10-CM

## 2020-09-15 DIAGNOSIS — E782 Mixed hyperlipidemia: Secondary | ICD-10-CM

## 2020-09-15 DIAGNOSIS — I252 Old myocardial infarction: Secondary | ICD-10-CM | POA: Diagnosis not present

## 2020-09-15 DIAGNOSIS — I2510 Atherosclerotic heart disease of native coronary artery without angina pectoris: Secondary | ICD-10-CM | POA: Diagnosis not present

## 2020-09-15 NOTE — Telephone Encounter (Signed)
Patient given detailed instructions per Myocardial Perfusion Study Information Sheet for the test on 09/19/20 at 8:15. Patient notified to arrive 15 minutes early and that it is imperative to arrive on time for appointment to keep from having the test rescheduled.  If you need to cancel or reschedule your appointment, please call the office within 24 hours of your appointment. . Patient verbalized understanding.Drew [name]

## 2020-09-15 NOTE — Addendum Note (Signed)
Addended by: Resa Miner I on: 09/15/2020 09:44 AM   Modules accepted: Orders

## 2020-09-15 NOTE — Addendum Note (Signed)
Addended by: Resa Miner I on: 09/15/2020 09:28 AM   Modules accepted: Orders

## 2020-09-15 NOTE — Patient Instructions (Signed)
Medication Instructions:  Your physician has recommended you make the following change in your medication:  DECREASE: Metoprolol 100 mg per 1/2 tablet daily *If you need a refill on your cardiac medications before your next appointment, please call your pharmacy*   Lab Work: None If you have labs (blood work) drawn today and your tests are completely normal, you will receive your results only by: Marland Kitchen MyChart Message (if you have MyChart) OR . A paper copy in the mail If you have any lab test that is abnormal or we need to change your treatment, we will call you to review the results.   Testing/Procedures: Your physician has requested that you have an echocardiogram. Echocardiography is a painless test that uses sound waves to create images of your heart. It provides your doctor with information about the size and shape of your heart and how well your heart's chambers and valves are working. This procedure takes approximately one hour. There are no restrictions for this procedure.  Your physician has requested that you have a carotid duplex. This test is an ultrasound of the carotid arteries in your neck. It looks at blood flow through these arteries that supply the brain with blood. Allow one hour for this exam. There are no restrictions or special instructions.    [facility] [address] [place], [place] [address] Phone:  [phone]    Please arrive 15 minutes prior to your appointment time for registration and insurance purposes.  The test will take approximately 3 to 4 hours to complete; you may bring reading material.  If someone comes with you to your appointment, they will need to remain in the main lobby due to limited space in the testing area. **If you are pregnant or breastfeeding, please notify the nuclear lab prior to your appointment**  How to prepare for your Myocardial Perfusion Test: . Do not eat or drink 3 hours prior to your test, except  you may have water. . Do not consume products containing caffeine (regular or decaffeinated) 12 hours prior to your test. (ex: coffee, chocolate, sodas, tea). . Do bring a list of your current medications with you.  If not listed below, you may take your medications as normal. . HOLD Erectile dysfunction medication: Viagra for 38 hours prior . Do wear comfortable clothes (no dresses or overalls) and walking shoes, tennis shoes preferred (No heels or open toe shoes are allowed). . Do NOT wear cologne, perfume, aftershave, or lotions (deodorant is allowed). . If these instructions are not followed, your test will have to be rescheduled.  Please report to [address] for your test.  If you have questions or concerns about your appointment, you can call the [facility] at [phone].  If you cannot keep your appointment, please provide 24 hours notification to the Nuclear Lab, to avoid a possible $50 charge to your account.    Follow-Up: At [facility], you and your health needs are our priority.  As part of our continuing mission to provide you with exceptional heart care, we have created designated Provider Care Teams.  These Care Teams include your primary Cardiologist (physician) and Advanced Practice Providers (APPs -  Physician Assistants and Nurse Practitioners) who all work together to provide you with the care you need, when you need it.  We recommend signing up for the patient portal called "MyChart".  Sign up information is provided on this After Visit Summary.  MyChart is used  to connect with patients for Virtual Visits (Telemedicine).  Patients are able to view lab/test results, encounter notes, upcoming appointments, etc.  Non-urgent messages can be sent to your provider as well.   To learn more about what you can do with MyChart, go to [email or web address].    Your next appointment:   2 month(s)  The format for your next appointment:    In Person  Provider:   Jenne Campus, MD   Other Instructions

## 2020-09-15 NOTE — Addendum Note (Signed)
Addended by: Senaida Ores on: 09/15/2020 02:04 PM   Modules accepted: Orders

## 2020-09-15 NOTE — Progress Notes (Signed)
Cardiology Office Note:    Date:  09/15/2020   ID:  Drew [name], DOB [birth year], MRN [id number]  PCP:  Angelina Sheriff, MD  Cardiologist:  Jenne Campus, MD    Referring MD: Angelina Sheriff, MD   Chief Complaint  Patient presents with  . Fatigue    History of Present Illness:    Drew [name] is a 79 y.o. male with past medical history significant for coronary artery disease, status post PTCA and stenting of the right coronary artery many years ago which resulted in mildly diminished left ventricle ejection fraction with inferior wall hypokinesis.  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  Also diabetes, hypertension, dyslipidemia.  Comes today 2 months of follow-up overall he complained of being weak and tired.  He does not have much energy.  He still try to take to visit daily living and does perform quite well but just not as profound fatigue and tiredness.  Last year he was in the hospital because of atypical chest pain, stress test was done which was negative.  Again his biggest complaint right now is being weak tired and fatigued.  No dizziness no passing out no syncope.  Recently he did see a surgeon for his aneurysm.  Still monitoring is required.  Past Medical History:  Diagnosis Date  . Acquired trigger finger of right middle finger 03/23/2020  . Aneurysm of thoracic aorta ([place])   . Aortic aneurysm ([place])   . Asthma 10/15/2016  . Barrett's esophagus   . Benign essential hypertension 04/22/2018  . Cancer ([place]) 2011   lung  . Chronic ischemic heart disease 04/22/2018  . COPD (chronic obstructive pulmonary disease) ([place])   . 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 Coronary artery disease 10/15/2016  . Diabetes type 2, uncontrolled ([place]) 04/22/2018  . Dyspnea 03/01/2015   Followed in Pulmonary clinic/ [facility]/ [place] - Labs 12/29/14 nl cbc, tsh, bmet  -  03/01/2015  Walked RA x 3 laps @ 185 ft each  stopped due to  End of study, no sob or desat - pfts 03/01/2015  Restrictive only with ERV 36% >> rec try off spiriva    . History of lung cancer 09/02/2017  . Hydrocele   . Hypercholesteremia 10/15/2016  . Hyperlipidemia   . Hypertension   . Ischemic heart disease   . 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 Pain in right hand 03/23/2020  . Peripheral vascular disease ([place])   . Renal artery stenosis ([place]) 10/15/2016  . Sleep disorder 04/22/2018  . 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 ABDOMINAL AORTIC ANEURYSM REPAIR W/ ENDOLUMINAL GRAFT  1999  . CORONARY ARTERY BYPASS GRAFT    . CORONARY STENT PLACEMENT  2003  . left lower lobectomy  2012    Current Medications: Current Meds  Medication Sig  . aspirin 81 MG tablet Take 81 mg by mouth daily.  . budesonide-formoterol (SYMBICORT) 80-4.5 MCG/ACT inhaler Inhale 2 puffs into the lungs 2 (two) times daily.  . Cholecalciferol (VITAMIN D3) 50 MCG (2000 UT) capsule Take 1 tablet by mouth daily.  . clopidogrel (PLAVIX) 75 MG tablet Take 75 mg by mouth daily.  . DULoxetine (CYMBALTA) 60 MG capsule Take 60 mg by mouth daily.  . famotidine (PEPCID) 10 MG tablet Take 10 mg by mouth at bedtime.   . finasteride (PROPECIA)  1 MG tablet Take 1 tablet by mouth daily.  . folic acid (FOLVITE) 0.5 MG tablet Take 1 tablet by mouth daily.  Marland Kitchen losartan (COZAAR) 100 MG tablet Take 100 mg by mouth daily.  . metFORMIN (GLUCOPHAGE-XR) 500 MG 24 hr tablet Take 500 mg by mouth 2 (two) times daily.   . metoprolol (TOPROL-XL) 200 MG 24 hr tablet Take 200 mg by mouth daily.  . montelukast (SINGULAIR) 10 MG tablet Take 1 tablet by mouth daily.  . nitroGLYCERIN (NITROSTAT) 0.4 MG SL tablet Place 0.4 mg under the tongue.  . Omega-3 Fatty Acids (FISH OIL) 1000 MG CAPS Take 1 tablet by mouth daily.  . pantoprazole (PROTONIX) 40 MG tablet Take 40 mg by mouth daily.  . rosuvastatin (CRESTOR) 20  MG tablet Take 1 tablet (20 mg total) by mouth daily.  . sildenafil (VIAGRA) 50 MG tablet Take 50 mg by mouth daily.  . tamsulosin (FLOMAX) 0.4 MG CAPS Take 0.4 mg by mouth daily.  . traZODone (DESYREL) 100 MG tablet Take 100 mg by mouth at bedtime.  . vitamin B-12 (CYANOCOBALAMIN) 100 MCG tablet Take 1 tablet by mouth daily.  . [DISCONTINUED] losartan (COZAAR) 50 MG tablet Take 1 tablet (50 mg total) by mouth daily. (Patient taking differently: Take 100 mg by mouth daily. )     Allergies:   Ethanol, Alcohol, Azithromycin, Tramadol hcl, Keflex [cephalexin], and Neomycin   Social History   Socioeconomic History  . Marital status: Married    Spouse name: Not on file  . Number of children: Not on file  . Years of education: Not on file  . Highest education level: Not on file  Occupational History  . Not on file  Tobacco Use  . Smoking status: Former Smoker    Packs/day: 2.00    Years: 36.00    Pack years: 72.00    Types: Cigarettes    Quit date: 10/14/1988    Years since quitting: 31.9  . Smokeless tobacco: Former Systems developer    Quit date: 04/09/1988  Vaping Use  . Vaping Use: Never used  Substance and Sexual Activity  . Alcohol use: No    Alcohol/week: 0.0 standard drinks  . Drug use: Not Currently  . Sexual activity: Not on file  Other Topics Concern  . Not on file  Social History Narrative  . Not on file   Social Determinants of Health   Financial Resource Strain:   . Difficulty of Paying Living Expenses: Not on file  Food Insecurity:   . Worried About Charity fundraiser in the Last Year: Not on file  . Ran Out of Food in the Last Year: Not on file  Transportation Needs:   . Lack of Transportation (Medical): Not on file  . Lack of Transportation (Non-Medical): Not on file  Physical Activity:   . Days of Exercise per Week: Not on file  . Minutes of Exercise per Session: Not on file  Stress:   . Feeling of Stress : Not on file  Social Connections:   . Frequency of  Communication with Friends and Family: Not on file  . Frequency of Social Gatherings with Friends and Family: Not on file  . Attends Religious Services: Not on file  . Active Member of Clubs or Organizations: Not on file  . Attends Archivist Meetings: Not on file  . Marital Status: Not on file     Family History: The patient's family history includes Brain cancer in his mother; CAD  in his father; Heart disease in his father and mother; Lung cancer in his father; Prostate cancer in his father; Thyroid disease in his sister. ROS:   Please see the history of present illness.    All 14 point review of systems negative except as described per history of present illness  EKGs/Labs/Other Studies Reviewed:      Recent Labs: No results found for requested labs within last 8760 hours.  Recent Lipid Panel    Component Value Date/Time   CHOL 112 06/09/2018 1052   TRIG 208 (H) 06/09/2018 1052   HDL 27 (L) 06/09/2018 1052   CHOLHDL 4.1 06/09/2018 1052   LDLCALC 43 06/09/2018 1052    Physical Exam:    VS:  BP 128/70 (BP Location: Left Arm, Patient Position: Sitting)   Pulse (!) 48   Ht 5\' 7"  (1.702 m)   Wt 148 lb (67.1 kg)   SpO2 96%   BMI 23.18 kg/m     Wt Readings from Last 3 Encounters:  09/15/20 148 lb (67.1 kg)  07/11/20 150 lb (68 kg)  12/14/19 155 lb (70.3 kg)     GEN:  Well nourished, well developed in no acute distress HEENT: Normal NECK: No JVD; No carotid bruits LYMPHATICS: No lymphadenopathy CARDIAC: RRR, no murmurs, no rubs, no gallops RESPIRATORY:  Clear to auscultation without rales, wheezing or rhonchi  ABDOMEN: Soft, non-tender, non-distended MUSCULOSKELETAL:  No edema; No deformity  SKIN: Warm and dry LOWER EXTREMITIES: no swelling NEUROLOGIC:  Alert and oriented x 3 PSYCHIATRIC:  Normal affect   ASSESSMENT:    1. Old MI (myocardial infarction)   2. Coronary artery disease involving native coronary artery of native heart without angina  pectoris   3. Primary hypertension   4. Mixed hyperlipidemia   5. Thoracic aortic aneurysm without rupture ([place])   6. Ischemic heart disease    PLAN:    In order of problems listed above:  1. All myocardial function, we will schedule him to have echocardiogram to reassess left ventricle ejection fraction. 2. Coronary artery disease he does have some chest pain which typically happen at rest but because of his past medical history significant for PTCA and stenting of the right coronary artery I will schedule him to have nuclear stress test. 3. Essential hypertension seems to be well controlled continue present management. 4. Mixed dyslipidemia I did review his K PN from primary care physician his cholesterol is excellent we will continue present management. 5. Thoracic aneurysm: That being followed by cardiothoracic surgeons. 6. Ischemic heart disease.  Echocardiogram will be done to assess left ventricle ejection fraction as well as stress test.  I also noted that he is bradycardic he is sinus bradycardia at EKG 51.  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   Medication Adjustments/Labs and Tests Ordered: Current medicines are reviewed at length with the patient today.  Concerns regarding medicines are outlined above.  No orders of the defined types were placed in this encounter.  Medication changes: No orders of the defined types were placed in this encounter.   Signed, Park Liter, MD, [facility] 09/15/2020 9:13 AM    [place]

## 2020-09-19 ENCOUNTER — Other Ambulatory Visit: Payer: Self-pay

## 2020-09-19 ENCOUNTER — Ambulatory Visit (INDEPENDENT_AMBULATORY_CARE_PROVIDER_SITE_OTHER): Payer: Medicare Other

## 2020-09-19 DIAGNOSIS — I2510 Atherosclerotic heart disease of native coronary artery without angina pectoris: Secondary | ICD-10-CM

## 2020-09-19 DIAGNOSIS — E782 Mixed hyperlipidemia: Secondary | ICD-10-CM

## 2020-09-19 DIAGNOSIS — I259 Chronic ischemic heart disease, unspecified: Secondary | ICD-10-CM | POA: Diagnosis not present

## 2020-09-19 DIAGNOSIS — I252 Old myocardial infarction: Secondary | ICD-10-CM

## 2020-09-19 DIAGNOSIS — I712 Thoracic aortic aneurysm, without rupture, unspecified: Secondary | ICD-10-CM

## 2020-09-19 DIAGNOSIS — I10 Essential (primary) hypertension: Secondary | ICD-10-CM

## 2020-09-19 LAB — MYOCARDIAL PERFUSION IMAGING
LV dias vol: 142 mL (ref 62–150)
LV sys vol: 93 mL
Peak HR: 77 {beats}/min
Rest HR: 50 {beats}/min
SDS: 2
SRS: 8
SSS: 10
TID: 1.07

## 2020-09-19 MED ORDER — REGADENOSON 0.4 MG/5ML IV SOLN
0.4000 mg | Freq: Once | INTRAVENOUS | Status: AC
Start: 2020-09-19 — End: 2020-09-19
  Administered 2020-09-19: 0.4 mg via INTRAVENOUS

## 2020-09-19 MED ORDER — TECHNETIUM TC 99M TETROFOSMIN IV KIT
10.7000 | PACK | Freq: Once | INTRAVENOUS | Status: AC | PRN
  Administered 2020-09-19: 10.7 via INTRAVENOUS

## 2020-09-19 MED ORDER — TECHNETIUM TC 99M TETROFOSMIN IV KIT
31.0000 | PACK | Freq: Once | INTRAVENOUS | Status: AC | PRN
  Administered 2020-09-19: 31 via INTRAVENOUS

## 2020-09-20 ENCOUNTER — Telehealth: Payer: Self-pay | Admitting: Cardiology

## 2020-09-20 NOTE — Telephone Encounter (Signed)
Pt called in returning returning Senaida Ores, RN  Call   Best number [phone]

## 2020-09-20 NOTE — Telephone Encounter (Signed)
Patient informed of results.  

## 2020-09-22 NOTE — Addendum Note (Signed)
Addended by: Jenne Campus on: 09/22/2020 08:54 AM   Modules accepted: Orders

## 2020-10-12 ENCOUNTER — Ambulatory Visit (INDEPENDENT_AMBULATORY_CARE_PROVIDER_SITE_OTHER): Payer: Medicare Other

## 2020-10-12 ENCOUNTER — Other Ambulatory Visit: Payer: Self-pay

## 2020-10-12 DIAGNOSIS — I712 Thoracic aortic aneurysm, without rupture, unspecified: Secondary | ICD-10-CM

## 2020-10-12 DIAGNOSIS — I259 Chronic ischemic heart disease, unspecified: Secondary | ICD-10-CM

## 2020-10-12 DIAGNOSIS — I10 Essential (primary) hypertension: Secondary | ICD-10-CM | POA: Diagnosis not present

## 2020-10-12 DIAGNOSIS — I2510 Atherosclerotic heart disease of native coronary artery without angina pectoris: Secondary | ICD-10-CM | POA: Diagnosis not present

## 2020-10-12 DIAGNOSIS — I252 Old myocardial infarction: Secondary | ICD-10-CM | POA: Diagnosis not present

## 2020-10-12 DIAGNOSIS — E782 Mixed hyperlipidemia: Secondary | ICD-10-CM

## 2020-10-12 DIAGNOSIS — I6521 Occlusion and stenosis of right carotid artery: Secondary | ICD-10-CM

## 2020-10-12 LAB — ECHOCARDIOGRAM COMPLETE
Area-P 1/2: 3.27 cm2
Calc EF: 39.3 %
S' Lateral: 5.4 cm
Single Plane A2C EF: 33.9 %
Single Plane A4C EF: 41.3 %

## 2020-10-12 NOTE — Progress Notes (Signed)
Carotid duplex exam performed  Jimmy Francys Bolin RDCS, RVT 

## 2020-10-12 NOTE — Progress Notes (Signed)
Complete echocardiogram performed.  Jimmy Keslee Harrington RDCS, RVT  

## 2020-11-16 ENCOUNTER — Encounter: Payer: Self-pay | Admitting: Cardiology

## 2020-11-16 ENCOUNTER — Ambulatory Visit (INDEPENDENT_AMBULATORY_CARE_PROVIDER_SITE_OTHER): Payer: Medicare Other | Admitting: Cardiology

## 2020-11-16 ENCOUNTER — Other Ambulatory Visit: Payer: Self-pay

## 2020-11-16 VITALS — BP 104/62 | HR 53 | Ht 67.0 in | Wt 146.0 lb

## 2020-11-16 DIAGNOSIS — I259 Chronic ischemic heart disease, unspecified: Secondary | ICD-10-CM

## 2020-11-16 DIAGNOSIS — E1165 Type 2 diabetes mellitus with hyperglycemia: Secondary | ICD-10-CM | POA: Diagnosis not present

## 2020-11-16 DIAGNOSIS — E7800 Pure hypercholesterolemia, unspecified: Secondary | ICD-10-CM

## 2020-11-16 DIAGNOSIS — I739 Peripheral vascular disease, unspecified: Secondary | ICD-10-CM

## 2020-11-16 DIAGNOSIS — I2510 Atherosclerotic heart disease of native coronary artery without angina pectoris: Secondary | ICD-10-CM

## 2020-11-16 NOTE — Progress Notes (Signed)
Cardiology Office Note:    Date:  11/16/2020   ID:  Drew [name], DOB [birth year], MRN [id number]  PCP:  Drew Sheriff, MD  Cardiologist:  Drew Campus, MD    Referring MD: Drew Sheriff, MD   Chief Complaint  Patient presents with  . Follow-up  I am doing the same  History of Present Illness:    Drew [name] is a 80 y.o. male past medical history significant for coronary artery disease, status post PTCA and stenting of the right coronary artery many years ago, mildly diminished left ventricle ejection fraction with inferior wall hypokinesis.  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  Also diabetes hypertension dyslipidemia.  He comes today to my office for follow-up.  He complained of having some heavy sensation in the chest that happen typically at evening time when he lays down watching TV.  He thinks is related to his esophagus.  We did cardiac testing he did have stress test which showed no evidence of ischemia, echocardiogram actually showed preserved left ventricle ejection fraction without segmental wall motion normalities, his ascending aortic aneurysm measuring 54 mm.  That being followed by Dr. Roxan [name].  Past Medical History:  Diagnosis Date  . Acquired trigger finger of right middle finger 03/23/2020  . Aneurysm of thoracic aorta ([place])   . Aortic aneurysm ([place])   . Asthma 10/15/2016  . Barrett's esophagus   . Benign essential hypertension 04/22/2018  . Cancer ([place]) 2011   lung  . Chronic ischemic heart disease 04/22/2018  . COPD (chronic obstructive pulmonary disease) ([place])   . 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 Coronary artery disease 10/15/2016  . Diabetes type 2, uncontrolled ([place]) 04/22/2018  . Dyspnea 03/01/2015   Followed in Pulmonary clinic/ [facility]/ [place] - Labs 12/29/14 nl cbc, tsh, bmet  -  03/01/2015  Walked RA x 3 laps @ 185 ft each stopped due to  End of study, no sob or desat - pfts  03/01/2015  Restrictive only with ERV 36% >> rec try off spiriva    . History of lung cancer 09/02/2017  . History of lung cancer 09/02/2017  . Hydrocele   . Hypercholesteremia 10/15/2016  . Hyperlipidemia   . Hypertension   . Ischemic heart disease   . 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 Pain in right hand 03/23/2020  . Peripheral vascular disease ([place])   . Renal artery stenosis ([place]) 10/15/2016  . Sleep disorder 04/22/2018  . 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 ABDOMINAL AORTIC ANEURYSM REPAIR W/ ENDOLUMINAL GRAFT  1999  . CORONARY ARTERY BYPASS GRAFT    . CORONARY STENT PLACEMENT  2003  . left lower lobectomy  2012    Current Medications: Current Meds  Medication Sig  . aspirin 81 MG tablet Take 81 mg by mouth daily.  . budesonide-formoterol (SYMBICORT) 80-4.5 MCG/ACT inhaler Inhale 2 puffs into the lungs 2 (two) times daily.  . Cholecalciferol (VITAMIN D3) 50 MCG (2000 UT) capsule Take 1,000 Units by mouth daily. 2500 units  . clopidogrel (PLAVIX) 75 MG tablet Take 75 mg by mouth daily.  . DULoxetine (CYMBALTA) 60 MG capsule Take 60 mg by mouth daily.  . famotidine (PEPCID) 10 MG tablet Take 10 mg by mouth at bedtime.   . finasteride (PROPECIA) 1 MG tablet Take 5 mg by mouth daily.  . folic acid (FOLVITE)  0.5 MG tablet Take 800 mcg by mouth daily.  Marland Kitchen gabapentin (NEURONTIN) 300 MG capsule Take 300 mg by mouth 3 (three) times daily.  Marland Kitchen loratadine (CLARITIN) 10 MG tablet Take 10 mg by mouth daily as needed for allergies.  Marland Kitchen losartan (COZAAR) 100 MG tablet Take 25 mg by mouth daily.  . metFORMIN (GLUCOPHAGE-XR) 500 MG 24 hr tablet Take 500 mg by mouth 2 (two) times daily.   . metoprolol (TOPROL-XL) 200 MG 24 hr tablet Take 100 mg by mouth daily.  . montelukast (SINGULAIR) 10 MG tablet Take 1 tablet by mouth daily.  . nitroGLYCERIN (NITROSTAT) 0.4 MG SL tablet Place 0.4 mg under the tongue.  .  Omega-3 Fatty Acids (FISH OIL) 1000 MG CAPS Take 1 tablet by mouth daily.  . pantoprazole (PROTONIX) 40 MG tablet Take 40 mg by mouth daily.  . rosuvastatin (CRESTOR) 20 MG tablet Take 1 tablet (20 mg total) by mouth daily.  . sildenafil (VIAGRA) 50 MG tablet Take 50 mg by mouth daily.  . traZODone (DESYREL) 100 MG tablet Take 100 mg by mouth at bedtime.  . vitamin B-12 (CYANOCOBALAMIN) 100 MCG tablet Take 2,500 mcg by mouth daily.     Allergies:   Ethanol, Alcohol, Azithromycin, Tramadol hcl, Keflex [cephalexin], and Neomycin   Social History   Socioeconomic History  . Marital status: Married    Spouse name: Not on file  . Number of children: Not on file  . Years of education: Not on file  . Highest education level: Not on file  Occupational History  . Not on file  Tobacco Use  . Smoking status: Former Smoker    Packs/day: 2.00    Years: 36.00    Pack years: 72.00    Types: Cigarettes    Quit date: 10/14/1988    Years since quitting: 32.1  . Smokeless tobacco: Former Systems developer    Quit date: 04/09/1988  Vaping Use  . Vaping Use: Never used  Substance and Sexual Activity  . Alcohol use: No    Alcohol/week: 0.0 standard drinks  . Drug use: Not Currently  . Sexual activity: Not on file  Other Topics Concern  . Not on file  Social History Narrative  . Not on file   Social Determinants of Health   Financial Resource Strain: Not on file  Food Insecurity: Not on file  Transportation Needs: Not on file  Physical Activity: Not on file  Stress: Not on file  Social Connections: Not on file     Family History: The patient's family history includes Brain cancer in his mother; CAD in his father; Heart disease in his father and mother; Lung cancer in his father; Prostate cancer in his father; Thyroid disease in his sister. ROS:   Please see the history of present illness.    All 14 point review of systems negative except as described per history of present illness  EKGs/Labs/Other  Studies Reviewed:      Recent Labs: No results found for requested labs within last 8760 hours.  Recent Lipid Panel    Component Value Date/Time   CHOL 112 06/09/2018 1052   TRIG 208 (H) 06/09/2018 1052   HDL 27 (L) 06/09/2018 1052   CHOLHDL 4.1 06/09/2018 1052   LDLCALC 43 06/09/2018 1052    Physical Exam:    VS:  BP 104/62 (BP Location: Right Arm, Patient Position: Sitting)   Pulse (!) 53   Ht 5\' 7"  (1.702 m)   Wt 146 lb (66.2 kg)  SpO2 98%   BMI 22.87 kg/m     Wt Readings from Last 3 Encounters:  11/16/20 146 lb (66.2 kg)  09/19/20 148 lb (67.1 kg)  09/15/20 148 lb (67.1 kg)     GEN:  Well nourished, well developed in no acute distress HEENT: Normal NECK: No JVD; No carotid bruits LYMPHATICS: No lymphadenopathy CARDIAC: RRR, no murmurs, no rubs, no gallops RESPIRATORY:  Clear to auscultation without rales, wheezing or rhonchi  ABDOMEN: Soft, non-tender, non-distended MUSCULOSKELETAL:  No edema; No deformity  SKIN: Warm and dry LOWER EXTREMITIES: no swelling NEUROLOGIC:  Alert and oriented x 3 PSYCHIATRIC:  Normal affect   ASSESSMENT:    1. Coronary artery disease involving native coronary artery of native heart without angina pectoris   2. Ischemic heart disease   3. Peripheral vascular disease ([place])   4. Uncontrolled type 2 diabetes mellitus with hyperglycemia ([place])   5. Hypercholesteremia    PLAN:    In order of problems listed above:  1. Coronary artery disease stress test negative but he still gets some atypical symptoms.  We did talk about options for the situation, last time I saw cut down his beta-blocker hoping that that will bring his blood pressure up and heart rate up and that make him feel better that did not improved any.  He is scheduled to see gastroenterologist Dr. Lyda Jester which I think is an excellent idea.  I see him back in about 3 months and see how he does if he still has some symptomatology we may relook at his coronary arteries  he may require coronary CT angio we may even consider cardiac catheterization. 2. Ischemic heart disease however last echocardiogram showed preserved left ventricle ejection fraction.  I wanted to add long-acting nitroglycerin he does not want to take it since he is taking Viagra on the regular basis. 3. Peripheral vascular disease, aortic aneurysm.  Follow-up by vascular surgeon from [place]. 4. Diabetes, I do have his hemoglobin A1c from summer 2021 which is 6.6. 5. Dyslipidemia I do have his fasting lipid profile from May 2021 with LDL of 39 and HDL of 29.  We will continue present management.   Medication Adjustments/Labs and Tests Ordered: Current medicines are reviewed at length with the patient today.  Concerns regarding medicines are outlined above.  No orders of the defined types were placed in this encounter.  Medication changes: No orders of the defined types were placed in this encounter.   Signed, Park Liter, MD, [facility] 11/16/2020 9:46 AM    [place]

## 2020-11-16 NOTE — Patient Instructions (Signed)

## 2020-11-17 ENCOUNTER — Other Ambulatory Visit: Payer: Self-pay | Admitting: *Deleted

## 2020-11-17 DIAGNOSIS — I712 Thoracic aortic aneurysm, without rupture, unspecified: Secondary | ICD-10-CM

## 2020-12-11 DIAGNOSIS — K219 Gastro-esophageal reflux disease without esophagitis: Secondary | ICD-10-CM | POA: Diagnosis not present

## 2020-12-14 DIAGNOSIS — R1310 Dysphagia, unspecified: Secondary | ICD-10-CM | POA: Diagnosis not present

## 2021-01-02 ENCOUNTER — Ambulatory Visit
Admission: RE | Admit: 2021-01-02 | Discharge: 2021-01-02 | Disposition: A | Discharge status: Home / Self Care | Payer: Medicare Other | Source: Ambulatory Visit | Attending: Thoracic Surgery (Cardiothoracic Vascular Surgery) | Admitting: Thoracic Surgery (Cardiothoracic Vascular Surgery)

## 2021-01-02 DIAGNOSIS — I712 Thoracic aortic aneurysm, without rupture, unspecified: Secondary | ICD-10-CM

## 2021-01-02 DIAGNOSIS — I2510 Atherosclerotic heart disease of native coronary artery without angina pectoris: Secondary | ICD-10-CM | POA: Diagnosis not present

## 2021-01-02 DIAGNOSIS — I700 Atherosclerosis of aorta: Secondary | ICD-10-CM | POA: Diagnosis not present

## 2021-01-02 MED ORDER — IOPAMIDOL (ISOVUE-370) INJECTION 76%
75.0000 mL | Freq: Once | INTRAVENOUS | Status: AC | PRN
  Administered 2021-01-02: 75 mL via INTRAVENOUS

## 2021-01-09 ENCOUNTER — Encounter: Payer: Self-pay | Admitting: Thoracic Surgery (Cardiothoracic Vascular Surgery)

## 2021-01-09 ENCOUNTER — Other Ambulatory Visit: Payer: Self-pay

## 2021-01-09 ENCOUNTER — Ambulatory Visit (INDEPENDENT_AMBULATORY_CARE_PROVIDER_SITE_OTHER): Payer: Medicare Other | Admitting: Thoracic Surgery (Cardiothoracic Vascular Surgery)

## 2021-01-09 VITALS — BP 130/78 | HR 78 | Resp 20 | Ht 67.0 in | Wt 148.0 lb

## 2021-01-09 DIAGNOSIS — I259 Chronic ischemic heart disease, unspecified: Secondary | ICD-10-CM | POA: Diagnosis not present

## 2021-01-09 DIAGNOSIS — Z6823 Body mass index (BMI) 23.0-23.9, adult: Secondary | ICD-10-CM | POA: Diagnosis not present

## 2021-01-09 DIAGNOSIS — I7121 Aneurysm of the ascending aorta, without rupture: Secondary | ICD-10-CM

## 2021-01-09 DIAGNOSIS — I712 Thoracic aortic aneurysm, without rupture, unspecified: Secondary | ICD-10-CM

## 2021-01-09 DIAGNOSIS — I2510 Atherosclerotic heart disease of native coronary artery without angina pectoris: Secondary | ICD-10-CM | POA: Diagnosis not present

## 2021-01-09 DIAGNOSIS — R079 Chest pain, unspecified: Secondary | ICD-10-CM | POA: Diagnosis not present

## 2021-01-09 DIAGNOSIS — J302 Other seasonal allergic rhinitis: Secondary | ICD-10-CM | POA: Diagnosis not present

## 2021-01-09 NOTE — Progress Notes (Signed)
[address]       [place] [address]             [phone]    HPI: [name] returns for scheduled follow-up of his ascending and descending aortic aneurysms  Drew [name] is a 80 year old man with a history of hypertension, abdominal aortic aneurysm status post stent graft, thoracic aortic atherosclerosis, ascending and descending thoracic aortic aneurysms, remote tobacco abuse, COPD, and lingular resection for lung cancer in 2011.  He was first noted to have a 5 cm ascending aneurysm in 2014.  He also had a slightly smaller descending aneurysm.  He has been followed since then.  At his last visit in September 2021 the radiologist read his CT as the aneurysm had increased to 5.5 cm.  My measurements it was about 5.2 to 5.3 cm.  We did increase his Cozaar at his last visit.  He has been checking his blood pressure at home.  He says been feeling some pain in his upper chest in the evenings.  He thinks it is related to reflux.  He does not experience this even with exertion earlier in the day.  He has seen Dr. Agustin [name] about this issue.  Past Medical History:  Diagnosis Date  . Acquired trigger finger of right middle finger 03/23/2020  . Aneurysm of thoracic aorta ([place])   . Aortic aneurysm ([place])   . Asthma 10/15/2016  . Barrett's esophagus   . Benign essential hypertension 04/22/2018  . Cancer ([place]) 2011   lung  . Chronic ischemic heart disease 04/22/2018  . COPD (chronic obstructive pulmonary disease) ([place])   . COPD mixed type ([place]) 01/27/2015  . Coronary artery disease 10/15/2016  . Diabetes type 2, uncontrolled ([place]) 04/22/2018  . Dyspnea 03/01/2015   Followed in Pulmonary clinic/ [facility]/ [facility] - Labs 12/29/14 nl cbc, tsh, bmet  -  03/01/2015  Walked RA x 3 laps @ 185 ft each stopped due to  End of study, no sob or desat - pfts 03/01/2015  Restrictive only with ERV 36% >> rec try off spiriva    . History of lung cancer 09/02/2017  . History of  lung cancer 09/02/2017  . Hydrocele   . Hypercholesteremia 10/15/2016  . Hyperlipidemia   . Hypertension   . Ischemic heart disease   . 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 Pain in right hand 03/23/2020  . Peripheral vascular disease ([place])   . Renal artery stenosis ([place]) 10/15/2016  . Sleep disorder 04/22/2018  . 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 aspirin 81 MG tablet Take 81 mg by mouth daily.    . budesonide-formoterol (SYMBICORT) 80-4.5 MCG/ACT inhaler Inhale 2 puffs into the lungs 2 (two) times daily.    . Cholecalciferol (VITAMIN D3) 50 MCG (2000 UT) capsule Take 1,000 Units by mouth daily. 2500 units    . clopidogrel (PLAVIX) 75 MG tablet Take 75 mg by mouth daily.    . DULoxetine (CYMBALTA) 60 MG capsule Take 60 mg by mouth daily.    . famotidine (PEPCID) 10 MG tablet Take 10 mg by mouth at bedtime.   3  . finasteride (PROPECIA) 1 MG tablet Take 5 mg by mouth daily.    . folic acid (FOLVITE) 0.5 MG tablet Take 800 mcg by mouth daily. (Patient not taking: Reported on 01/09/2021)    . gabapentin (NEURONTIN) 300 MG capsule Take  300 mg by mouth 3 (three) times daily. (Patient not taking: Reported on 01/09/2021)    . loratadine (CLARITIN) 10 MG tablet Take 10 mg by mouth daily as needed for allergies.    Marland Kitchen losartan (COZAAR) 100 MG tablet Take 25 mg by mouth daily.    . metFORMIN (GLUCOPHAGE-XR) 500 MG 24 hr tablet Take 500 mg by mouth 2 (two) times daily.     . metoprolol (TOPROL-XL) 200 MG 24 hr tablet Take 100 mg by mouth daily.    . montelukast (SINGULAIR) 10 MG tablet Take 1 tablet by mouth daily.    . nitroGLYCERIN (NITROSTAT) 0.4 MG SL tablet Place 0.4 mg under the tongue.    . Omega-3 Fatty Acids (FISH OIL) 1000 MG CAPS Take 2 tablets by mouth daily.    . pantoprazole (PROTONIX) 40 MG tablet Take 40 mg by mouth daily.    . rosuvastatin (CRESTOR) 20 MG tablet Take 1 tablet (20 mg total)  by mouth daily. 90 tablet 3  . sildenafil (VIAGRA) 50 MG tablet Take 50 mg by mouth daily.    . traZODone (DESYREL) 100 MG tablet Take 100 mg by mouth at bedtime.    . vitamin B-12 (CYANOCOBALAMIN) 100 MCG tablet Take 2,500 mcg by mouth daily.     No current facility-administered medications for this visit.    Physical Exam BP 130/78   Pulse 78   Resp 20   Ht 5\' 7"  (1.702 m)   Wt 148 lb (67.1 kg)   SpO2 92% Comment: RA  BMI 23.76 kg/m  80 year old man in no acute distress Alert and oriented x3 with no focal deficits No carotid bruit Cardiac regular rate and rhythm with a normal S1 and S2, faint systolic murmur (3-5/3) Lungs clear with equal breath sounds bilaterally No peripheral edema  Diagnostic Tests: CT ANGIOGRAPHY CHEST WITH CONTRAST  TECHNIQUE: Multidetector CT imaging of the chest was performed using the standard protocol during bolus administration of intravenous contrast. Multiplanar CT image reconstructions and MIPs were obtained to evaluate the vascular anatomy.  CONTRAST:  21mL ISOVUE-370 IOPAMIDOL (ISOVUE-370) INJECTION 76%  COMPARISON:  07/06/2020, 11/26/2019, 04/28/2018  FINDINGS: Cardiovascular: The thoracic aortic aneurysm is again identified. Measurements are as follows:  Sinuses of Valsalva, 4.1 cm, coronal reformat # 41  Ascending aorta, 5.1 x 5.2 cm, sagittal # 80/coronal # 86  Aortic arch, 2.9 x 3.0 cm, axial image # 37/coronal image # 108  Descending thoracic aorta, level of the pulmonary arterial bifurcation, 4.8 x 4.5 cm, coronal image # 124/sagittal image # 109  Diaphragmatic hiatus, 3.7 x 3.4 cm, coronal image # 100/sagittal image # 97  Mild coronary artery calcification. Global cardiac size is within normal limits. Left ventricular apical thinning again noted. No pericardial effusion. Central pulmonary arteries are of normal caliber.  Mediastinum/Nodes: No enlarged mediastinal, hilar, or axillary lymph nodes. Thyroid  gland, trachea, and esophagus demonstrate no significant findings.  Lungs/Pleura: Surgical changes of partial left upper lobectomy are identified. Resultant left-sided volume loss noted. No focal pulmonary nodules or infiltrates. No pneumothorax or pleural effusion. Central airways are widely patent.  Upper Abdomen: Limited images of the upper abdomen are unremarkable.  Musculoskeletal: No acute bone abnormality  Review of the MIP images confirms the above findings.  IMPRESSION: Stable thoracic aortic aneurysm with maximal transaxial dimension of 5.2 cm involving the ascending segment. Recommend semi-annual imaging followup by CTA or MRA and referral to cardiothoracic surgery if not already obtained. This recommendation follows 2010 ACCF/AHA/AATS/ACR/ASA/SCA/SCAI/SIR/STS/SVM Guidelines for the  Diagnosis and Management of Patients With Thoracic Aortic Disease. Circulation. 2010; 121: U314-H702. Aortic aneurysm NOS (ICD10-I71.9)  Mild coronary artery calcification. Left ventricular apical thinning.  Status post partial left upper lobectomy.  Aortic aneurysm NOS (ICD10-I71.9).   Electronically Signed   By: Fidela Salisbury MD   On: 01/02/2021 15:33 I personally reviewed the CT images.  There are postoperative changes.  No suspicious lung nodules.  Ascending aneurysm stable at 5.2 cm.  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  Lung cancer-lingular segmentectomy in 2011.  No evidence of recurrent disease.  Thoracic aortic atherosclerosis-on Crestor and fish oil.  Ascending aortic aneurysm- stable at 5.2 cm.  Needs continued semiannual follow-up.  Descending  thoracic aneurysm-stable at 4.8 cm.  Needs continued semiannual follow-up.  Hypertension-blood pressure well controlled on current regimen.  Understands importance of blood pressure control and need for home monitoring.  Plan: Monitor blood pressure Return in 6 months with CT angio of chest  I spent 20 minutes in review of images, records, and in consultation with Drew [name]. Melrose Nakayama, MD [facility] [phone]

## 2021-01-10 ENCOUNTER — Telehealth: Payer: Self-pay | Admitting: Emergency Medicine

## 2021-01-10 NOTE — Telephone Encounter (Signed)
Called patient added him to schedule per Dr. Agustin Cree.

## 2021-01-11 DIAGNOSIS — H903 Sensorineural hearing loss, bilateral: Secondary | ICD-10-CM | POA: Insufficient documentation

## 2021-01-11 DIAGNOSIS — N400 Enlarged prostate without lower urinary tract symptoms: Secondary | ICD-10-CM | POA: Insufficient documentation

## 2021-01-11 DIAGNOSIS — M545 Low back pain, unspecified: Secondary | ICD-10-CM | POA: Insufficient documentation

## 2021-01-11 DIAGNOSIS — E119 Type 2 diabetes mellitus without complications: Secondary | ICD-10-CM

## 2021-01-11 DIAGNOSIS — J309 Allergic rhinitis, unspecified: Secondary | ICD-10-CM

## 2021-01-11 DIAGNOSIS — L408 Other psoriasis: Secondary | ICD-10-CM

## 2021-01-11 DIAGNOSIS — G4700 Insomnia, unspecified: Secondary | ICD-10-CM

## 2021-01-11 DIAGNOSIS — M542 Cervicalgia: Secondary | ICD-10-CM | POA: Insufficient documentation

## 2021-01-11 DIAGNOSIS — M1990 Unspecified osteoarthritis, unspecified site: Secondary | ICD-10-CM | POA: Insufficient documentation

## 2021-01-11 HISTORY — DX: Type 2 diabetes mellitus without complications: E11.9

## 2021-01-11 HISTORY — DX: Cervicalgia: M54.2

## 2021-01-11 HISTORY — DX: Insomnia, unspecified: G47.00

## 2021-01-11 HISTORY — DX: Allergic rhinitis, unspecified: J30.9

## 2021-01-11 HISTORY — DX: Other psoriasis: L40.8

## 2021-01-11 HISTORY — DX: Sensorineural hearing loss, bilateral: H90.3

## 2021-01-11 HISTORY — DX: Low back pain, unspecified: M54.50

## 2021-01-12 ENCOUNTER — Other Ambulatory Visit: Payer: Self-pay

## 2021-01-12 ENCOUNTER — Ambulatory Visit (INDEPENDENT_AMBULATORY_CARE_PROVIDER_SITE_OTHER): Payer: Medicare Other | Admitting: Cardiology

## 2021-01-12 ENCOUNTER — Encounter: Payer: Self-pay | Admitting: Cardiology

## 2021-01-12 VITALS — BP 128/80 | HR 52 | Ht 67.0 in | Wt 144.0 lb

## 2021-01-12 DIAGNOSIS — I10 Essential (primary) hypertension: Secondary | ICD-10-CM

## 2021-01-12 DIAGNOSIS — E782 Mixed hyperlipidemia: Secondary | ICD-10-CM | POA: Diagnosis not present

## 2021-01-12 DIAGNOSIS — I252 Old myocardial infarction: Secondary | ICD-10-CM | POA: Diagnosis not present

## 2021-01-12 DIAGNOSIS — I2510 Atherosclerotic heart disease of native coronary artery without angina pectoris: Secondary | ICD-10-CM

## 2021-01-12 MED ORDER — RANOLAZINE ER 500 MG PO TB12: 500.0000 mg | ORAL_TABLET | Freq: Two times a day (BID) | ORAL | 2 refills | Status: DC

## 2021-01-12 NOTE — Addendum Note (Signed)
Addended by: Senaida Ores on: 01/12/2021 09:59 AM   Modules accepted: Orders

## 2021-01-12 NOTE — Progress Notes (Signed)
Cardiology Office Note:    Date:  01/12/2021   ID:  Drew [name], DOB [birth year], MRN [id number]  PCP:  Angelina Sheriff, MD  Cardiologist:  Jenne Campus, MD    Referring MD: Angelina Sheriff, MD   Chief Complaint  Patient presents with  . Chest Pain    History of Present Illness:    Drew [name] is a 80 y.o. male with past medical history significant for coronary artery disease, status post PTCA and stenting of the right coronary artery many years ago, mildly diminished left ventricle ejection fraction with inferior wall hypokinesis, last assessment of his coronary artery was done by stress test in December of last year also ascending arctic aneurysm followed by CT surgeon from [place] also history of diabetes hypertension dyslipidemia.  Comes today 2 months for follow-up he has been complaining of having some chest pain but lately it is much better he started using more stomach medication that seems to be helping.  Still gets some.  Yesterday he said he got up he went to breast-feed state start having pain took Mylanta with complete relief.  Past Medical History:  Diagnosis Date  . Acquired trigger finger of right middle finger 03/23/2020  . Allergic rhinitis 01/11/2021  . Aneurysm of thoracic aorta ([place])   . Aortic aneurysm ([place])   . Asthma 10/15/2016  . Atherosclerotic heart disease of native coronary artery without angina pectoris 04/22/2018  . Barrett's esophagus   . Benign essential hypertension 04/22/2018  . Bilateral sensorineural hearing loss 01/11/2021  . Cancer ([place]) 2011   lung  . Chronic ischemic heart disease 04/22/2018  . Chronic obstructive lung disease ([place])   . COPD (chronic obstructive pulmonary disease) ([place])   . COPD mixed type ([place]) 01/27/2015  . Coronary artery disease 10/15/2016  . Diabetes type 2, uncontrolled ([place]) 04/22/2018  . Dyspnea 03/01/2015   Followed in Pulmonary clinic/ [facility]/ [facility] - Labs 12/29/14 nl cbc, tsh, bmet  -   03/01/2015  Walked RA x 3 laps @ 185 ft each stopped due to  End of study, no sob or desat - pfts 03/01/2015  Restrictive only with ERV 36% >> rec try off spiriva    . Esophageal reflux   . History of lung cancer 09/02/2017  . History of lung cancer 09/02/2017  . Hydrocele   . Hypercholesteremia 10/15/2016  . Hyperlipidemia   . Hypertension   . Insomnia 01/11/2021  . Ischemic heart disease   . Low back pain 01/11/2021  . Neck pain 01/11/2021  . 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  . Pain in right hand 03/23/2020  . Peripheral vascular disease ([place])   . Renal artery stenosis ([place]) 10/15/2016  . Sleep disorder 04/22/2018  . 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 Type 2 diabetes mellitus ([place]) 01/11/2021    Past Surgical History:  Procedure Laterality Date  . ABDOMINAL AORTIC ANEURYSM REPAIR W/ ENDOLUMINAL GRAFT  1999  . CORONARY ARTERY BYPASS GRAFT    . CORONARY STENT PLACEMENT  2003  . left lower lobectomy  2012    Current Medications: Current Meds  Medication Sig  . aspirin 81 MG tablet Take 81 mg by mouth daily.  . budesonide-formoterol (SYMBICORT) 80-4.5 MCG/ACT inhaler Inhale 2 puffs into the lungs 2 (two) times daily.  . Cholecalciferol (VITAMIN D3) 50 MCG (2000 UT) capsule Take 1,000 Units by mouth daily. 2500 units  . clopidogrel (PLAVIX) 75 MG tablet  Take 75 mg by mouth daily.  . DULoxetine (CYMBALTA) 60 MG capsule Take 60 mg by mouth daily.  . famotidine (PEPCID) 10 MG tablet Take 10 mg by mouth at bedtime.   . finasteride (PROPECIA) 1 MG tablet Take 5 mg by mouth daily.  . folic acid (FOLVITE) 0.5 MG tablet Take 800 mcg by mouth daily.  Marland Kitchen gabapentin (NEURONTIN) 300 MG capsule Take 300 mg by mouth 2 (two) times daily. 1 in the morning and 2 at night  . loratadine (CLARITIN) 10 MG tablet Take 10 mg by mouth daily as needed for allergies.  Marland Kitchen losartan (COZAAR) 100 MG tablet Take 25 mg by mouth daily.  . metoprolol  (TOPROL-XL) 200 MG 24 hr tablet Take 200 mg by mouth daily.  . montelukast (SINGULAIR) 10 MG tablet Take 10 mg by mouth daily.  . nitroGLYCERIN (NITROSTAT) 0.4 MG SL tablet Place 0.4 mg under the tongue every 5 (five) minutes as needed for chest pain.  . Omega-3 Fatty Acids (FISH OIL) 1000 MG CAPS Take 2 tablets by mouth daily.  . pantoprazole (PROTONIX) 40 MG tablet Take 40 mg by mouth daily.  . rosuvastatin (CRESTOR) 20 MG tablet Take 1 tablet (20 mg total) by mouth daily.  . Simethicone (GAS-X PO) Take 1 tablet by mouth at bedtime.  . tamsulosin (FLOMAX) 0.4 MG CAPS capsule Take 0.4 mg by mouth daily after supper.  . traZODone (DESYREL) 100 MG tablet Take 100 mg by mouth at bedtime.  . vitamin B-12 (CYANOCOBALAMIN) 100 MCG tablet Take 2,500 mcg by mouth daily.     Allergies:   Ethanol, Alcohol, Azithromycin, Lisinopril, Tramadol hcl, Cephalexin, and Neomycin   Social History   Socioeconomic History  . Marital status: Married    Spouse name: Not on file  . Number of children: Not on file  . Years of education: Not on file  . Highest education level: Not on file  Occupational History  . Not on file  Tobacco Use  . Smoking status: Former Smoker    Packs/day: 2.00    Years: 36.00    Pack years: 72.00    Types: Cigarettes    Quit date: 10/14/1988    Years since quitting: 32.2  . Smokeless tobacco: Former Systems developer    Quit date: 04/09/1988  Vaping Use  . Vaping Use: Never used  Substance and Sexual Activity  . Alcohol use: No    Alcohol/week: 0.0 standard drinks  . Drug use: Not Currently  . Sexual activity: Not on file  Other Topics Concern  . Not on file  Social History Narrative  . Not on file   Social Determinants of Health   Financial Resource Strain: Not on file  Food Insecurity: Not on file  Transportation Needs: Not on file  Physical Activity: Not on file  Stress: Not on file  Social Connections: Not on file     Family History: The patient's family history  includes Brain cancer in his mother; CAD in his father; Heart disease in his father and mother; Lung cancer in his father; Prostate cancer in his father; Thyroid disease in his sister. ROS:   Please see the history of present illness.    All 14 point review of systems negative except as described per history of present illness  EKGs/Labs/Other Studies Reviewed:      Recent Labs: No results found for requested labs within last 8760 hours.  Recent Lipid Panel    Component Value Date/Time   CHOL 112 06/09/2018 1052  TRIG 208 (H) 06/09/2018 1052   HDL 27 (L) 06/09/2018 1052   CHOLHDL 4.1 06/09/2018 1052   LDLCALC 43 06/09/2018 1052    Physical Exam:    VS:  BP 128/80 (BP Location: Left Arm, Patient Position: Sitting)   Pulse (!) 52   Ht 5\' 7"  (1.702 m)   Wt 144 lb (65.3 kg)   SpO2 94%   BMI 22.55 kg/m     Wt Readings from Last 3 Encounters:  01/12/21 144 lb (65.3 kg)  01/09/21 148 lb (67.1 kg)  11/16/20 146 lb (66.2 kg)     GEN:  Well nourished, well developed in no acute distress HEENT: Normal NECK: No JVD; No carotid bruits LYMPHATICS: No lymphadenopathy CARDIAC: RRR, no murmurs, no rubs, no gallops RESPIRATORY:  Clear to auscultation without rales, wheezing or rhonchi  ABDOMEN: Soft, non-tender, non-distended MUSCULOSKELETAL:  No edema; No deformity  SKIN: Warm and dry LOWER EXTREMITIES: no swelling NEUROLOGIC:  Alert and oriented x 3 PSYCHIATRIC:  Normal affect   ASSESSMENT:    1. Atherosclerosis of native coronary artery of native heart without angina pectoris   2. Benign essential hypertension   3. Coronary artery disease involving native coronary artery of native heart without angina pectoris   4. Old MI (myocardial infarction)   5. Mixed hyperlipidemia    PLAN:    In order of problems listed above:  1. Coronary disease stress test reviewed from December negative.  Symptoms are still somewhat concerning I will start him with ranolazine 500 mg twice  daily. 2. Benign essential hypertension, blood pressure well controlled we will continue present management. 3. Dyslipidemia I did review his K PN which show me LDL of 43 with HDL 29 needs a good cholesterol profile continue present medications. 4. Overall will try antianginal therapy to see how he respond.  In view of last stress test being normal I have low level suspicion for severe coronary disease activation.   Medication Adjustments/Labs and Tests Ordered: Current medicines are reviewed at length with the patient today.  Concerns regarding medicines are outlined above.  No orders of the defined types were placed in this encounter.  Medication changes: No orders of the defined types were placed in this encounter.   Signed, Park Liter, MD, [facility] 01/12/2021 9:53 AM    [place]

## 2021-01-12 NOTE — Patient Instructions (Signed)
Medication Instructions:  Your physician has recommended you make the following change in your medication:   START: Ranolazine 500 mg twice daily   *If you need a refill on your cardiac medications before your next appointment, please call your pharmacy*   Lab Work: None If you have labs (blood work) drawn today and your tests are completely normal, you will receive your results only by: Marland Kitchen MyChart Message (if you have MyChart) OR . A paper copy in the mail If you have any lab test that is abnormal or we need to change your treatment, we will call you to review the results.   Testing/Procedures: None   Follow-Up: At [facility], you and your health needs are our priority.  As part of our continuing mission to provide you with exceptional heart care, we have created designated Provider Care Teams.  These Care Teams include your primary Cardiologist (physician) and Advanced Practice Providers (APPs -  Physician Assistants and Nurse Practitioners) who all work together to provide you with the care you need, when you need it.  We recommend signing up for the patient portal called "MyChart".  Sign up information is provided on this After Visit Summary.  MyChart is used to connect with patients for Virtual Visits (Telemedicine).  Patients are able to view lab/test results, encounter notes, upcoming appointments, etc.  Non-urgent messages can be sent to your provider as well.   To learn more about what you can do with MyChart, go to [email or web address].    Your next appointment:   4 month(s)  The format for your next appointment:   In Person  Provider:   Jenne Campus, MD   Other Instructions  Ranolazine tablets, extended release What is this medicine? RANOLAZINE (ra NOE la zeen) is a heart medicine. It is used to treat chronic chest pain (angina). This medicine must be taken regularly. It will not relieve an acute episode of chest pain. This medicine may be used for other  purposes; ask your health care provider or pharmacist if you have questions. COMMON BRAND NAME(S): Ranexa What should I tell my health care provider before I take this medicine? They need to know if you have any of these conditions:  heart disease  irregular heartbeat  kidney disease  liver disease  low levels of potassium or magnesium in the blood  an unusual or allergic reaction to ranolazine, other medicines, foods, dyes, or preservatives  pregnant or trying to get pregnant  breast-feeding How should I use this medicine? Take this medicine by mouth with a glass of water. Follow the directions on the prescription label. Do not cut, crush, or chew this medicine. Take with or without food. Do not take this medication with grapefruit juice. Take your doses at regular intervals. Do not take your medicine more often then directed. Talk to your pediatrician regarding the use of this medicine in children. Special care may be needed. Overdosage: If you think you have taken too much of this medicine contact a poison control center or emergency room at once. NOTE: This medicine is only for you. Do not share this medicine with others. What if I miss a dose? If you miss a dose, take it as soon as you can. If it is almost time for your next dose, take only that dose. Do not take double or extra doses. What may interact with this medicine? Do not take this medicine with any of the following medications:  antivirals for HIV or AIDS  cerivastatin  certain antibiotics like chloramphenicol, clarithromycin, dalfopristin; quinupristin, isoniazid, rifabutin, rifampin, rifapentine  certain medicines used for cancer like imatinib, nilotinib  certain medicines for fungal infections like fluconazole, itraconazole, ketoconazole, posaconazole, voriconazole  certain medicines for irregular heart beat like dronedarone  certain medicines for seizures like carbamazepine, fosphenytoin, oxcarbazepine,  phenobarbital, phenytoin  cisapride  conivaptan  cyclosporine  grapefruit or grapefruit juice  lumacaftor; ivacaftor  nefazodone  pimozide  quinacrine  St John's wort  thioridazine This medicine may also interact with the following medications:  alfuzosin  certain medicines for depression, anxiety, or psychotic disturbances like bupropion, citalopram, fluoxetine, fluphenazine, paroxetine, perphenazine, risperidone, sertraline, trifluoperazine  certain medicines for cholesterol like atorvastatin, lovastatin, simvastatin  certain medicines for stomach problems like octreotide, palonosetron, prochlorperazine  eplerenone  ergot alkaloids like dihydroergotamine, ergonovine, ergotamine, methylergonovine  metformin  nicardipine  other medicines that prolong the QT interval (cause an abnormal heart rhythm) like dofetilide, ziprasidone  sirolimus  tacrolimus This list may not describe all possible interactions. Give your health care provider a list of all the medicines, herbs, non-prescription drugs, or dietary supplements you use. Also tell them if you smoke, drink alcohol, or use illegal drugs. Some items may interact with your medicine. What should I watch for while using this medicine? Visit your doctor for regular check ups. Tell your doctor or healthcare professional if your symptoms do not start to get better or if they get worse. This medicine will not relieve an acute attack of angina or chest pain. This medicine can change your heart rhythm. Your health care provider may check your heart rhythm by ordering an electrocardiogram (ECG) while you are taking this medicine. You may get drowsy or dizzy. Do not drive, use machinery, or do anything that needs mental alertness until you know how this medicine affects you. Do not stand or sit up quickly, especially if you are an older patient. This reduces the risk of dizzy or fainting spells. Alcohol may interfere with the  effect of this medicine. Avoid alcoholic drinks. If you are scheduled for any medical or dental procedure, tell your healthcare provider that you are taking this medicine. This medicine can interact with other medicines used during surgery. What side effects may I notice from receiving this medicine? Side effects that you should report to your doctor or health care professional as soon as possible:  allergic reactions like skin rash, itching or hives, swelling of the face, lips, or tongue  breathing problems  changes in vision  fast, irregular or pounding heartbeat  feeling faint or lightheaded, falls  low or high blood pressure  numbness or tingling feelings  ringing in the ears  tremor or shakiness  slow heartbeat (fewer than 50 beats per minute)  swelling of the legs or feet Side effects that usually do not require medical attention (report to your doctor or health care professional if they continue or are bothersome):  constipation  drowsy  dry mouth  headache  nausea or vomiting  stomach upset This list may not describe all possible side effects. Call your doctor for medical advice about side effects. You may report side effects to FDA at 1-800-FDA-1088. Where should I keep my medicine? Keep out of the reach of children. Store at room temperature between 15 and 30 degrees C (59 and 86 degrees F). Throw away any unused medicine after the expiration date. NOTE: This sheet is a summary. It may not cover all possible information. If you have questions about this medicine, talk to your  doctor, pharmacist, or health care provider.  2021 Elsevier/Gold Standard (2018-09-22 09:18:49)

## 2021-02-19 ENCOUNTER — Ambulatory Visit: Payer: Medicare Other | Admitting: Cardiology

## 2021-03-04 DIAGNOSIS — R058 Other specified cough: Secondary | ICD-10-CM | POA: Diagnosis not present

## 2021-03-04 DIAGNOSIS — J069 Acute upper respiratory infection, unspecified: Secondary | ICD-10-CM | POA: Diagnosis not present

## 2021-03-07 DIAGNOSIS — Z20828 Contact with and (suspected) exposure to other viral communicable diseases: Secondary | ICD-10-CM | POA: Diagnosis not present

## 2021-03-07 DIAGNOSIS — J40 Bronchitis, not specified as acute or chronic: Secondary | ICD-10-CM | POA: Diagnosis not present

## 2021-03-07 DIAGNOSIS — J329 Chronic sinusitis, unspecified: Secondary | ICD-10-CM | POA: Diagnosis not present

## 2021-03-07 DIAGNOSIS — U071 COVID-19: Secondary | ICD-10-CM | POA: Diagnosis not present

## 2021-03-07 DIAGNOSIS — I10 Essential (primary) hypertension: Secondary | ICD-10-CM | POA: Diagnosis not present

## 2021-03-14 DIAGNOSIS — U071 COVID-19: Secondary | ICD-10-CM | POA: Diagnosis not present

## 2021-03-22 DIAGNOSIS — Z6823 Body mass index (BMI) 23.0-23.9, adult: Secondary | ICD-10-CM | POA: Diagnosis not present

## 2021-03-22 DIAGNOSIS — D510 Vitamin B12 deficiency anemia due to intrinsic factor deficiency: Secondary | ICD-10-CM | POA: Diagnosis not present

## 2021-03-22 DIAGNOSIS — R634 Abnormal weight loss: Secondary | ICD-10-CM | POA: Diagnosis not present

## 2021-03-22 DIAGNOSIS — R5383 Other fatigue: Secondary | ICD-10-CM | POA: Diagnosis not present

## 2021-03-28 DIAGNOSIS — D510 Vitamin B12 deficiency anemia due to intrinsic factor deficiency: Secondary | ICD-10-CM | POA: Diagnosis not present

## 2021-04-03 DIAGNOSIS — S20219A Contusion of unspecified front wall of thorax, initial encounter: Secondary | ICD-10-CM | POA: Diagnosis not present

## 2021-04-03 DIAGNOSIS — W19XXXA Unspecified fall, initial encounter: Secondary | ICD-10-CM | POA: Diagnosis not present

## 2021-04-04 DIAGNOSIS — D510 Vitamin B12 deficiency anemia due to intrinsic factor deficiency: Secondary | ICD-10-CM | POA: Diagnosis not present

## 2021-04-11 DIAGNOSIS — D510 Vitamin B12 deficiency anemia due to intrinsic factor deficiency: Secondary | ICD-10-CM | POA: Diagnosis not present

## 2021-04-24 ENCOUNTER — Other Ambulatory Visit: Payer: Self-pay | Admitting: Cardiology

## 2021-05-15 ENCOUNTER — Other Ambulatory Visit: Payer: Self-pay | Admitting: Thoracic Surgery (Cardiothoracic Vascular Surgery)

## 2021-05-15 DIAGNOSIS — I712 Thoracic aortic aneurysm, without rupture, unspecified: Secondary | ICD-10-CM

## 2021-05-17 DIAGNOSIS — J449 Chronic obstructive pulmonary disease, unspecified: Secondary | ICD-10-CM | POA: Diagnosis not present

## 2021-05-17 DIAGNOSIS — R634 Abnormal weight loss: Secondary | ICD-10-CM | POA: Diagnosis not present

## 2021-05-17 DIAGNOSIS — I700 Atherosclerosis of aorta: Secondary | ICD-10-CM | POA: Diagnosis not present

## 2021-05-17 DIAGNOSIS — N200 Calculus of kidney: Secondary | ICD-10-CM | POA: Diagnosis not present

## 2021-05-17 DIAGNOSIS — N281 Cyst of kidney, acquired: Secondary | ICD-10-CM | POA: Diagnosis not present

## 2021-05-17 DIAGNOSIS — Z85118 Personal history of other malignant neoplasm of bronchus and lung: Secondary | ICD-10-CM | POA: Diagnosis not present

## 2021-05-17 DIAGNOSIS — I712 Thoracic aortic aneurysm, without rupture: Secondary | ICD-10-CM | POA: Diagnosis not present

## 2021-05-21 DIAGNOSIS — E538 Deficiency of other specified B group vitamins: Secondary | ICD-10-CM | POA: Diagnosis not present

## 2021-05-29 ENCOUNTER — Other Ambulatory Visit: Payer: Self-pay

## 2021-05-30 ENCOUNTER — Ambulatory Visit: Payer: PPO | Admitting: Cardiology

## 2021-05-30 ENCOUNTER — Other Ambulatory Visit: Payer: Self-pay

## 2021-05-30 ENCOUNTER — Encounter: Payer: Self-pay | Admitting: Cardiology

## 2021-05-30 VITALS — BP 124/68 | HR 55 | Ht 67.0 in | Wt 140.6 lb

## 2021-05-30 DIAGNOSIS — I712 Thoracic aortic aneurysm, without rupture, unspecified: Secondary | ICD-10-CM

## 2021-05-30 DIAGNOSIS — I10 Essential (primary) hypertension: Secondary | ICD-10-CM | POA: Diagnosis not present

## 2021-05-30 DIAGNOSIS — E7800 Pure hypercholesterolemia, unspecified: Secondary | ICD-10-CM

## 2021-05-30 DIAGNOSIS — I25118 Atherosclerotic heart disease of native coronary artery with other forms of angina pectoris: Secondary | ICD-10-CM | POA: Diagnosis not present

## 2021-05-30 NOTE — Progress Notes (Signed)
Cardiology Office Note:    Date:  05/30/2021   ID:  Drew [name], DOB [birth year], MRN [id number]  PCP:  Angelina Sheriff, MD  Cardiologist:  Jenne Campus, MD    Referring MD: Angelina Sheriff, MD   No chief complaint on file. Am doing much better  History of Present Illness:    Drew [name] is a 80 y.o. male with past medical history significant for coronary artery disease, status post PTCA and stenting of the right coronary artery done many years ago.  He also got mildly diminished left ventricular ejection fraction with inferior wall hypokinesis, last assessment of coronary artery disease was done and stress test in December 2021 showing no evidence of ischemia.  He also got history of ascending aortic aneurysm followed by CT surgeon in [place].  History of hypertension dyslipidemia and diabetes. Is coming today to my office for follow-up in spite of normal stress test he was still having some chest pain suspicion for coronary artery disease.  He treated this with proton pump inhibitor with partial relief last time I seen him I gave him a trial of her nose and to see if he improves and he tells me today he is dramatically improved he can walk climb stairs with no difficulty.  Overall now he is asymptomatic.  Past Medical History:  Diagnosis Date   Acquired trigger finger of right middle finger 03/23/2020   Allergic rhinitis 01/11/2021   Aneurysm of thoracic aorta (HCC)    Aortic aneurysm ([place])    Asthma 10/15/2016   Atherosclerotic heart disease of native coronary artery without angina pectoris 04/22/2018   Barrett's esophagus    Benign essential hypertension 04/22/2018   Bilateral sensorineural hearing loss 01/11/2021   Cancer ([place]) 2011   lung   Chronic ischemic heart disease 04/22/2018   Chronic obstructive lung disease ([place])    COPD (chronic obstructive pulmonary disease) ([place])    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   Coronary artery disease 10/15/2016   Diabetes  type 2, uncontrolled ([place]) 04/22/2018   Dyspnea 03/01/2015   Followed in Pulmonary clinic/ [facility]/ [place] - Labs 12/29/14 nl cbc, tsh, bmet  -  03/01/2015  Walked RA x 3 laps @ 185 ft each stopped due to  End of study, no sob or desat - pfts 03/01/2015  Restrictive only with ERV 36% >> rec try off spiriva     Esophageal reflux    History of lung cancer 09/02/2017   History of lung cancer 09/02/2017   Hydrocele    Hypercholesteremia 10/15/2016   Hyperlipidemia    Hypertension    Insomnia 01/11/2021   Ischemic heart disease    Low back pain 01/11/2021   Neck pain 01/11/2021   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   Pain in right hand 03/23/2020   Peripheral vascular disease ([place])    Renal artery stenosis ([place]) 10/15/2016   Sleep disorder 04/22/2018   Thoracic aortic aneurysm without mention of rupture 2011   Type 2 diabetes mellitus ([place]) 01/11/2021    Past Surgical History:  Procedure Laterality Date   ABDOMINAL AORTIC ANEURYSM REPAIR W/ ENDOLUMINAL GRAFT  1999   CORONARY ARTERY BYPASS GRAFT     CORONARY STENT PLACEMENT  2003   left lower lobectomy  2012    Current Medications: No outpatient medications have been marked as taking for the 05/30/21 encounter (Office Visit) with Park Liter, MD.     Allergies:  Ethanol, Alcohol, Azithromycin, Lisinopril, Tramadol hcl, Cephalexin, and Neomycin   Social History   Socioeconomic History   Marital status: Married    Spouse name: Not on file   Number of children: Not on file   Years of education: Not on file   Highest education level: Not on file  Occupational History   Not on file  Tobacco Use   Smoking status: Former    Packs/day: 2.00    Years: 36.00    Pack years: 72.00    Types: Cigarettes    Quit date: 10/14/1988    Years since quitting: 32.6   Smokeless tobacco: Former    Quit date: 04/09/1988  Vaping Use   Vaping Use: Never used  Substance and Sexual Activity   Alcohol use: No     Alcohol/week: 0.0 standard drinks   Drug use: Not Currently   Sexual activity: Not on file  Other Topics Concern   Not on file  Social History Narrative   Not on file   Social Determinants of Health   Financial Resource Strain: Not on file  Food Insecurity: Not on file  Transportation Needs: Not on file  Physical Activity: Not on file  Stress: Not on file  Social Connections: Not on file     Family History: The patient's family history includes Brain cancer in his mother; CAD in his father; Heart disease in his father and mother; Lung cancer in his father; Prostate cancer in his father; Thyroid disease in his sister. ROS:   Please see the history of present illness.    All 14 point review of systems negative except as described per history of present illness  EKGs/Labs/Other Studies Reviewed:      Recent Labs: No results found for requested labs within last 8760 hours.  Recent Lipid Panel    Component Value Date/Time   CHOL 112 06/09/2018 1052   TRIG 208 (H) 06/09/2018 1052   HDL 27 (L) 06/09/2018 1052   CHOLHDL 4.1 06/09/2018 1052   LDLCALC 43 06/09/2018 1052    Physical Exam:    VS:  BP 124/68 (BP Location: Right Arm, Patient Position: Sitting)   Pulse (!) 55   Ht 5\' 7"  (1.702 m)   Wt 140 lb 9.6 oz (63.8 kg)   SpO2 94%   BMI 22.02 kg/m     Wt Readings from Last 3 Encounters:  05/30/21 140 lb 9.6 oz (63.8 kg)  01/12/21 144 lb (65.3 kg)  01/09/21 148 lb (67.1 kg)     GEN:  Well nourished, well developed in no acute distress HEENT: Normal NECK: No JVD; No carotid bruits LYMPHATICS: No lymphadenopathy CARDIAC: RRR, no murmurs, no rubs, no gallops RESPIRATORY:  Clear to auscultation without rales, wheezing or rhonchi  ABDOMEN: Soft, non-tender, non-distended MUSCULOSKELETAL:  No edema; No deformity  SKIN: Warm and dry LOWER EXTREMITIES: no swelling NEUROLOGIC:  Alert and oriented x 3 PSYCHIATRIC:  Normal affect   ASSESSMENT:    1. Coronary  artery disease of native artery of native heart with stable angina pectoris ([place])   2. Essential hypertension   3. Thoracic aortic aneurysm without rupture ([place])   4. Hypercholesteremia    PLAN:    In order of problems listed above:  Coronary artery disease stress test negative in December but he does have fairly typical symptoms.  I gave him ranolazine with good relief.  He is asymptomatic right now.  We will continue with antiplatelet therapy as well as antianginal therapy. Ascending aortic aneurysm.  I did review his CT from May 19, 2021 showing dilatation of the ascending and descending thoracic aorta ascending portion measuring 5.4 cm distending 5.2 cm which was increased compared to 5 cm on the previous exam.  Again he has been follow-up with cardiothoracic surgery next month.  His blood pressure seems to well controlled and I asked him to avoid isovolumetric exercises. Essential hypertension blood pressure controlled well.  Hyperlipidemia, his LDL was 43 HDL 29 and he is of Crestor 20 which I will continue.    Medication Adjustments/Labs and Tests Ordered: Current medicines are reviewed at length with the patient today.  Concerns regarding medicines are outlined above.  No orders of the defined types were placed in this encounter.  Medication changes: No orders of the defined types were placed in this encounter.   Signed, Park Liter, MD, [facility] 05/30/2021 3:04 PM    [place]

## 2021-05-30 NOTE — Patient Instructions (Signed)

## 2021-07-02 ENCOUNTER — Encounter: Payer: Self-pay | Admitting: Family Medicine

## 2021-07-03 ENCOUNTER — Ambulatory Visit: Payer: PPO | Admitting: Thoracic Surgery (Cardiothoracic Vascular Surgery)

## 2021-07-09 DIAGNOSIS — Z7902 Long term (current) use of antithrombotics/antiplatelets: Secondary | ICD-10-CM | POA: Diagnosis not present

## 2021-07-09 DIAGNOSIS — K2270 Barrett's esophagus without dysplasia: Secondary | ICD-10-CM | POA: Diagnosis not present

## 2021-07-09 DIAGNOSIS — I711 Thoracic aortic aneurysm, ruptured: Secondary | ICD-10-CM | POA: Diagnosis not present

## 2021-07-09 DIAGNOSIS — K219 Gastro-esophageal reflux disease without esophagitis: Secondary | ICD-10-CM | POA: Diagnosis not present

## 2021-07-10 ENCOUNTER — Ambulatory Visit: Payer: PPO | Admitting: Physician Assistant

## 2021-07-10 ENCOUNTER — Telehealth: Payer: Self-pay

## 2021-07-10 ENCOUNTER — Other Ambulatory Visit: Payer: Self-pay

## 2021-07-10 VITALS — BP 145/80 | HR 55 | Resp 20 | Ht 67.0 in | Wt 139.0 lb

## 2021-07-10 DIAGNOSIS — I712 Thoracic aortic aneurysm, without rupture, unspecified: Secondary | ICD-10-CM

## 2021-07-10 DIAGNOSIS — I7121 Aneurysm of the ascending aorta, without rupture: Secondary | ICD-10-CM

## 2021-07-10 NOTE — Telephone Encounter (Signed)
   Name: Drew [name]  DOB: [birth year]  MRN: [id number]  Primary Cardiologist: Dr. Fraser Din, MD   Chart reviewed as part of pre-operative protocol coverage. Because of Alex A Conry's past medical history including an ascending and descending thoracic aorta dilation measuring 5.4 cm>>>distending 5.2 cm which was increased compared to 5 cm on the previous exam. He is being seen by [facility] today, 07/10/21. I will forward this to Dr. Agustin Cree, however I feel that the patient will need to wait for EGD until work up complete. Will send to Dr. Agustin Cree for his recommendations as well.   Kathyrn Drown, NP  07/10/2021, 9:51 AM

## 2021-07-10 NOTE — Progress Notes (Signed)
[address]       [place] [address]             [phone]        Drew [name] [id number] 05-15-1941   History of Present Illness:  Drew [name] is a 80 year old man with a history of hypertension, abdominal aortic aneurysm status post stent graft, thoracic aortic atherosclerosis, ascending and descending thoracic aortic aneurysms, remote tobacco abuse, COPD, and lingular resection for lung cancer in 2011.   He was first noted to have a 5 cm ascending aneurysm in 2014.  He also had a slightly smaller descending aneurysm.  He has been followed since then.   At his last visit in March of this year the patient's aneurysm remained stable.   Since that time the patient had developed some weight loss after experiencing COVID 19 about 2 months ago.  Workup at that time was un-diagnostic.  He underwent CT CAP back in August, and the patient did not have a repeat CTA today.  He states his blood pressure remains well controlled.  He denies chest, back, and abdominal pain.  He is having some issues with GERD and is undergoing GI workup.    Current Outpatient Medications on File Prior to Visit  Medication Sig Dispense Refill   albuterol (VENTOLIN HFA) 108 (90 Base) MCG/ACT inhaler Inhale 2 puffs into the lungs as needed for shortness of breath or wheezing.     aspirin 81 MG tablet Take 81 mg by mouth daily.     budesonide-formoterol (SYMBICORT) 80-4.5 MCG/ACT inhaler Inhale 2 puffs into the lungs 2 (two) times daily.     Cholecalciferol (VITAMIN D3) 50 MCG (2000 UT) capsule Take 1,000 Units by mouth daily. 2500 units     clopidogrel (PLAVIX) 75 MG tablet Take 75 mg by mouth daily.     DULoxetine (CYMBALTA) 60 MG capsule Take 60 mg by mouth daily.     famotidine (PEPCID) 10 MG tablet Take 10 mg by mouth at bedtime.   3   finasteride (PROPECIA) 1 MG tablet Take 5 mg by mouth daily.     folic acid (FOLVITE) 0.5 MG tablet Take 800 mcg by mouth daily.      gabapentin (NEURONTIN) 300 MG capsule Take 300 mg by mouth 2 (two) times daily. 1 in the morning and 2 at night     loratadine (CLARITIN) 10 MG tablet Take 10 mg by mouth daily as needed for allergies.     losartan (COZAAR) 100 MG tablet Take 25 mg by mouth daily.     metFORMIN (GLUCOPHAGE) 500 MG tablet Take 500 mg by mouth 2 (two) times daily.     metoprolol (TOPROL-XL) 200 MG 24 hr tablet Take 200 mg by mouth daily.     montelukast (SINGULAIR) 10 MG tablet Take 10 mg by mouth daily.     nitroGLYCERIN (NITROSTAT) 0.4 MG SL tablet Place 0.4 mg under the tongue every 5 (five) minutes as needed for chest pain.     Omega-3 Fatty Acids (FISH OIL) 1000 MG CAPS Take 2 tablets by mouth daily.     pantoprazole (PROTONIX) 40 MG tablet Take 40 mg by mouth daily.     ranolazine (RANEXA) 500 MG 12 hr tablet Take 1 tablet by mouth twice daily 180 tablet 2   rosuvastatin (CRESTOR) 20 MG tablet Take 1 tablet (20 mg total) by mouth daily. 90 tablet 3   sildenafil (VIAGRA) 100 MG tablet Take 0.5 mg  by mouth as needed for erectile dysfunction.     Simethicone (GAS-X PO) Take 1 tablet by mouth at bedtime.     tamsulosin (FLOMAX) 0.4 MG CAPS capsule Take 0.4 mg by mouth daily after supper.     traZODone (DESYREL) 100 MG tablet Take 100 mg by mouth at bedtime.     vitamin B-12 (CYANOCOBALAMIN) 100 MCG tablet Take 2,500 mcg by mouth daily.     No current facility-administered medications on file prior to visit.     BP (!) 145/80   Pulse (!) 55   Resp 20   Ht 5\' 7"  (1.702 m)   Wt 139 lb (63 kg)   SpO2 97% Comment: RA  BMI 21.77 kg/m   Physical Exam  Gen: no apparent distress Heart: RRR Lungs: CTA bilaterally Abd: soft non tender Neuro: AAO x 3  CT CAP Results:  Aneurysmal dilatation of the ascending and descending thoracic aorta measuring 5.4 cm and 5.2 cm.    A/P:  Ascending Aortic Aneurysm- unfortunately the patient did not get his CTA performed.  Imaging from CT scan of CAP was reviewed and  base off my measurements, it appears to be measuring 5.2-5.3 cm in diameter, which overall would be stable from previous exam.   HTN- slightly elevated today, patient got lost on way here and feels this is causing this.  Review of recent cardiology visits show BP under good control. Plan-  patient did not have CTA performed due to recent CT scan.  I explained to the patient that it is important to have CTA performed as this is a better test to evaluate the aorta properly.  I will plan to have patient RTC in 6 months with CTA of chest.  However I will discuss with Dr. Roxan Hockey to ensure patient doesn't need to return sooner with proper imaging.  Patient was counseled on importance of should he develop new onset chest pain, ripping/tearing sensation he should report to the ED... Pamphlet was also provided.   Risk Modification: Patient provided education/couseling  Statin: yes  Patient was counseled on importance of Blood Pressure Control.  Despite Medical intervention if the patient notices persistently elevated blood pressure readings.  They are instructed to contact their Primary Care Physician  Please avoid use of Fluoroquinolones as this can potentially increase your risk of Aortic Rupture and/or Dissection  Ellwood Handler, PA-C 07/10/21

## 2021-07-10 NOTE — Patient Instructions (Signed)
Thoracic Aortic Aneurysm An aneurysm is a bulge in an artery. It happens when blood pushes up against a weakened or damaged artery wall. A thoracic aortic aneurysm is an aneurysm that occurs in the first part of the aorta, between the heart and the diaphragm. The aorta is the main artery of the body. It supplies blood from the heart to the rest of the body. Some aneurysms may not cause symptoms or problems. However, a thoracic aortic aneurysm can cause two serious problems: It can enlarge and burst (rupture). It can cause blood to flow between the layers of the wall of the aorta through a tear (aortic dissection). Both of these problems are medical emergencies. They can cause bleeding inside the body and can be life-threatening if they are not diagnosed and treated right away. What are the causes? The exact cause of this condition is not known. What increases the risk? The following factors may make you more likely to develop this condition: Being 45 years of age or older. Having a family history of aneurysms. Using tobacco. Having any of these conditions: Hardening of the arteries caused by the buildup of fat and other substances in the lining of a blood vessel (arteriosclerosis). Inflammation of the walls of an artery (arteritis). A genetic disease that weakens the body's connective tissue, such as Marfan syndrome. An injury or trauma to the aorta. High blood pressure (hypertension). High cholesterol. An infection from bacteria, such as syphilis or staphylococcus, in the wall of the aorta (infectious aortitis). What are the signs or symptoms? Symptoms of this condition vary depending on the size of the aneurysm and how fast it is growing. Most grow slowly and do not cause symptoms. When symptoms do occur, they may include: Pain in the chest, back, sides, or abdomen. The pain may vary in intensity. Sudden, severe pain may indicate that the aneurysm has  ruptured. Hoarseness. Cough. Shortness of breath. Swallowing problems. Swelling in the face, arms, or legs. Fever. Unexplained weight loss. How is this diagnosed? This condition may be diagnosed with: An ultrasound. X-rays. CT scan. MRI. A test to check the arteries for damage or blockages (angiogram). Most unruptured thoracic aortic aneurysms cause no symptoms, so they are often found during exams for other conditions. How is this treated? Treatment for this condition depends on: The size of the aneurysm. How fast the aneurysm is growing. Your age. Risk factors for rupture. Small aneurysms (2.2 inches, or 5.5 cm, or less) may be managed with: Medicines to: Control blood pressure. Manage pain. Fight infection. Regular monitoring. This may include an ultrasound or CT scan every year or every 6 months to see if the aneurysm is getting bigger. Large or fast-growing aneurysms may be treated with surgery. Follow these instructions at home: Eating and drinking  Eat a healthy diet. Your health care provider may recommend that you: Lower your salt (sodium) intake. In some people, too much salt can raise blood pressure and increase the risk for thoracic aortic aneurysm. Avoid foods that are high in saturated fat and cholesterol, such as red meat and full-fat dairy. Eat a diet that is low in sugar. Increase your fiber intake by including whole grains, vegetables, and fruits in your diet. Eating these foods may help to lower your blood pressure. Do not drink alcohol if your health care provider tells you not to drink. If you drink alcohol: Limit how much you use to: 0-1 drink a day for women who are not pregnant. 0-2 drinks a day for  men. Be aware of how much alcohol is in your drink. In the [place] one drink equals one 12 oz bottle of beer (355 mL), one 5 oz glass of wine (148 mL), or one 1 oz glass of hard liquor (44 mL). Lifestyle Do not use any products that contain nicotine or  tobacco, such as cigarettes, e-cigarettes, and chewing tobacco. If you need help quitting, ask your health care provider. Maintain a healthy weight. Check your blood pressure regularly. Follow your health care provider's instructions on how to keep your blood pressure within normal limits. Have your blood sugar (glucose) level and cholesterol levels checked regularly. Follow your health care provider's instructions on how to keep levels within normal limits. Activity  Stay physically active and exercise regularly. Talk with your health care provider about how often you should exercise and ask which types of exercise are best for you. Avoid heavy lifting and activities that take a lot of effort. Ask your health care provider what activities are safe for you. General instructions Take over-the-counter and prescription medicines only as told by your health care provider. Talk with your health care provider about regular screenings to see if the aneurysm is getting bigger. Keep all follow-up visits as told by your health care provider. This is important. Contact a health care provider if you have: Unexplained weight loss. Get help right away if you have: Pain in your upper back, neck, or abdomen. This pain may move into your chest and arms. Trouble swallowing. A cough or hoarseness. Shortness of breath. Summary A thoracic aortic aneurysm is an aneurysm that occurs in the first part of the aorta, between the heart and the diaphragm. As a thoracic aortic aneurysm becomes larger, it can burst (rupture), or blood can flow between the layers of the wall of the aorta through a tear (aorticdissection). These conditions can be life-threatening if they are not diagnosed and treated right away. If you have a thoracic aortic aneurysm, its growth will be closely monitored. Surgical repair may be needed for larger or faster-growing aneurysms. This information is not intended to replace advice given to you by  your health care provider. Make sure you discuss any questions you have with your health care provider. Document Revised: 12/11/2020 Document Reviewed: 12/11/2020 Elsevier Patient Education  2022 [employer].

## 2021-07-10 NOTE — Telephone Encounter (Signed)
   [facility] Pre-operative Risk Assessment    Request for surgical clearance:  What type of surgery is being performed? EGD   When is this surgery scheduled? 08/03/2021   What type of clearance is required (medical clearance vs. Pharmacy clearance to hold med vs. Both)? Both  Are there any medications that need +to be held prior to surgery and how -long?To hold Aspirin and Plavix on 07/29/2021   Practice name and name of physician performing surgery? Dr. Kyra Leyland  at [facility]   What is your office phone number: [phone]   7.   What is your office fax number: [phone]  8.   Anesthesia type (None, local, MAC, general) ? Not specified   Drew [name] 07/10/2021, 7:52 AM  _________________________________________________________________   (provider comments below) --

## 2021-07-11 NOTE — Telephone Encounter (Signed)
   Name: Drew [name]  DOB: Mar 02, 1941  MRN: [id number]   Primary Cardiologist: Jenne Campus, MD  Chart reviewed as part of pre-operative protocol coverage.   Patient was seen by [facility] 07/10/21 and recommended for repeat CT Chest in 6 months for close monitoring of thoracic aortic aneurysm.   Dr. Agustin Cree, can you comment on preop risk for EGD? Can you also comment on recommendations for holding aspirin and plavix prior to her upcoming procedure? She has a history of CAD s/p remote stenting to RCA. Please route your response back to P CV DIV PREOP. Thanks!  Abigail Butts, PA-C 07/11/2021, 11:36 AM

## 2021-07-12 NOTE — Telephone Encounter (Signed)
I s/w Santiago Glad with [facility]. I clarified with Santiago Glad states the pt is having SCREENING COLONOSCOPY; as far as the EGD this is for follow up on Barrett's Esophagus. I assured Santiago Glad, once we have the pt cleared we will fax over clearance. Santiago Glad, thanked me for the help and the call.   In regards to Dr. Agustin Cree states he is waiting on CT; I had informed the pre op provider that Dr. Leonarda Salon office cancelled the CT as per note on cancellation that CT was done 05/17/21. Per pre op provider today Robbie Lis, PAC to make note of this and let Dr. Agustin Cree know CT was cancelled by requesting office that ordered CT.

## 2021-07-16 NOTE — Telephone Encounter (Signed)
   Primary Cardiologist: Jenne Campus, MD  Chart reviewed as part of pre-operative protocol coverage. Given past medical history and time since last visit, based on ACC/AHA guidelines, Drew [name] would be at acceptable risk for the planned procedure without further cardiovascular testing.   His Plavix and aspirin may be held for up to 7 days prior to surgery.  Please resume as soon as hemostasis is achieved.  I will route this recommendation to the requesting party via Epic fax function and remove from pre-op pool.  Please call with questions.  Jossie Ng. [name] NP-C    07/16/2021, 11:55 AM [address] Office [phone] Fax [phone]

## 2021-07-17 ENCOUNTER — Other Ambulatory Visit: Payer: PPO

## 2021-07-17 ENCOUNTER — Ambulatory Visit: Payer: Medicare Other | Admitting: Thoracic Surgery (Cardiothoracic Vascular Surgery)

## 2021-07-18 DIAGNOSIS — D510 Vitamin B12 deficiency anemia due to intrinsic factor deficiency: Secondary | ICD-10-CM | POA: Diagnosis not present

## 2021-08-10 DIAGNOSIS — E119 Type 2 diabetes mellitus without complications: Secondary | ICD-10-CM | POA: Diagnosis not present

## 2021-08-10 DIAGNOSIS — K2270 Barrett's esophagus without dysplasia: Secondary | ICD-10-CM | POA: Diagnosis not present

## 2021-08-10 DIAGNOSIS — J439 Emphysema, unspecified: Secondary | ICD-10-CM | POA: Diagnosis not present

## 2021-08-10 DIAGNOSIS — K2100 Gastro-esophageal reflux disease with esophagitis, without bleeding: Secondary | ICD-10-CM | POA: Diagnosis not present

## 2021-08-10 DIAGNOSIS — K2210 Ulcer of esophagus without bleeding: Secondary | ICD-10-CM | POA: Diagnosis not present

## 2021-08-10 DIAGNOSIS — K219 Gastro-esophageal reflux disease without esophagitis: Secondary | ICD-10-CM | POA: Diagnosis not present

## 2021-08-10 DIAGNOSIS — I2510 Atherosclerotic heart disease of native coronary artery without angina pectoris: Secondary | ICD-10-CM | POA: Diagnosis not present

## 2021-08-10 DIAGNOSIS — Z1211 Encounter for screening for malignant neoplasm of colon: Secondary | ICD-10-CM | POA: Diagnosis not present

## 2021-08-10 DIAGNOSIS — K449 Diaphragmatic hernia without obstruction or gangrene: Secondary | ICD-10-CM | POA: Diagnosis not present

## 2021-08-10 DIAGNOSIS — I10 Essential (primary) hypertension: Secondary | ICD-10-CM | POA: Diagnosis not present

## 2021-08-10 DIAGNOSIS — I252 Old myocardial infarction: Secondary | ICD-10-CM | POA: Diagnosis not present

## 2021-08-10 DIAGNOSIS — Z955 Presence of coronary angioplasty implant and graft: Secondary | ICD-10-CM | POA: Diagnosis not present

## 2021-08-10 DIAGNOSIS — Z95828 Presence of other vascular implants and grafts: Secondary | ICD-10-CM | POA: Diagnosis not present

## 2021-08-10 DIAGNOSIS — Z7984 Long term (current) use of oral hypoglycemic drugs: Secondary | ICD-10-CM | POA: Diagnosis not present

## 2021-08-10 DIAGNOSIS — Z7982 Long term (current) use of aspirin: Secondary | ICD-10-CM | POA: Diagnosis not present

## 2021-08-10 DIAGNOSIS — Z8719 Personal history of other diseases of the digestive system: Secondary | ICD-10-CM | POA: Diagnosis not present

## 2021-08-15 DIAGNOSIS — D510 Vitamin B12 deficiency anemia due to intrinsic factor deficiency: Secondary | ICD-10-CM | POA: Diagnosis not present

## 2021-08-31 DIAGNOSIS — Z23 Encounter for immunization: Secondary | ICD-10-CM | POA: Diagnosis not present

## 2021-09-18 DIAGNOSIS — E538 Deficiency of other specified B group vitamins: Secondary | ICD-10-CM | POA: Diagnosis not present

## 2021-10-17 DIAGNOSIS — D510 Vitamin B12 deficiency anemia due to intrinsic factor deficiency: Secondary | ICD-10-CM | POA: Diagnosis not present

## 2021-10-30 DIAGNOSIS — H903 Sensorineural hearing loss, bilateral: Secondary | ICD-10-CM | POA: Diagnosis not present

## 2021-11-09 DIAGNOSIS — H903 Sensorineural hearing loss, bilateral: Secondary | ICD-10-CM | POA: Diagnosis not present

## 2021-11-15 DIAGNOSIS — D510 Vitamin B12 deficiency anemia due to intrinsic factor deficiency: Secondary | ICD-10-CM | POA: Diagnosis not present

## 2021-11-22 ENCOUNTER — Other Ambulatory Visit: Payer: Self-pay | Admitting: Thoracic Surgery (Cardiothoracic Vascular Surgery)

## 2021-11-22 DIAGNOSIS — I712 Thoracic aortic aneurysm, without rupture, unspecified: Secondary | ICD-10-CM

## 2021-12-05 ENCOUNTER — Other Ambulatory Visit: Payer: Self-pay

## 2021-12-05 DIAGNOSIS — H35031 Hypertensive retinopathy, right eye: Secondary | ICD-10-CM | POA: Insufficient documentation

## 2021-12-05 DIAGNOSIS — Z7189 Other specified counseling: Secondary | ICD-10-CM | POA: Insufficient documentation

## 2021-12-10 ENCOUNTER — Other Ambulatory Visit: Payer: Self-pay

## 2021-12-10 ENCOUNTER — Ambulatory Visit: Payer: PPO | Admitting: Cardiology

## 2021-12-10 VITALS — BP 108/64 | HR 54 | Ht 67.0 in | Wt 141.0 lb

## 2021-12-10 DIAGNOSIS — I7121 Aneurysm of the ascending aorta, without rupture: Secondary | ICD-10-CM | POA: Diagnosis not present

## 2021-12-10 DIAGNOSIS — I10 Essential (primary) hypertension: Secondary | ICD-10-CM

## 2021-12-10 DIAGNOSIS — I252 Old myocardial infarction: Secondary | ICD-10-CM | POA: Diagnosis not present

## 2021-12-10 DIAGNOSIS — I25118 Atherosclerotic heart disease of native coronary artery with other forms of angina pectoris: Secondary | ICD-10-CM

## 2021-12-10 DIAGNOSIS — E782 Mixed hyperlipidemia: Secondary | ICD-10-CM

## 2021-12-10 NOTE — Patient Instructions (Signed)

## 2021-12-10 NOTE — Progress Notes (Signed)
Cardiology Office Note:    Date:  12/10/2021   ID:  Fredderick [name], DOB 08/21/1941, MRN [id number]  PCP:  Angelina Sheriff, MD  Cardiologist:  Jenne Campus, MD    Referring MD: Angelina Sheriff, MD   No chief complaint on file. Doing well  History of Present Illness:    Drew [name] is a 81 y.o. male   with past medical history significant for coronary artery disease, status post PTCA and stenting of the right coronary artery done many years ago.  He also got mildly diminished left ventricular ejection fraction with inferior wall hypokinesis, last assessment of coronary artery disease was done and stress test in December 2021 showing no evidence of ischemia.  He also got history of ascending aortic aneurysm followed by CT surgeon in [place].  History of hypertension dyslipidemia and diabetes. Comes today to my office for follow-up overall he seems to be doing well.  He denies any chest pain tightness squeezing pressure burning chest palpitation dizziness swelling of lower extremities.  Past Medical History:  Diagnosis Date   Acquired trigger finger of right middle finger 03/23/2020   Allergic rhinitis 01/11/2021   Aneurysm of thoracic aorta (HCC)    Aortic aneurysm ([place])    Asthma 10/15/2016   Atherosclerotic heart disease of native coronary artery without angina pectoris 04/22/2018   Barrett's esophagus    Benign essential hypertension 04/22/2018   Bilateral sensorineural hearing loss 01/11/2021   Cancer ([place]) 2011   lung   Chronic ischemic heart disease 04/22/2018   Chronic obstructive lung disease ([place])    COPD (chronic obstructive pulmonary disease) ([place])    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   Coronary artery disease 10/15/2016   Diabetes type 2, uncontrolled ([place]) 04/22/2018   Dyspnea 03/01/2015   Followed in Pulmonary clinic/ [facility]/ [facility] - Labs 12/29/14 nl cbc, tsh, bmet  -  03/01/2015  Walked RA x 3 laps @ 185 ft each stopped due to  End of study, no  sob or desat - pfts 03/01/2015  Restrictive only with ERV 36% >> rec try off spiriva     Esophageal reflux    History of lung cancer 09/02/2017   History of lung cancer 09/02/2017   Hydrocele    Hypercholesteremia 10/15/2016   Hyperlipidemia    Hypertension    Insomnia 01/11/2021   Ischemic heart disease    Low back pain 01/11/2021   Neck pain 01/11/2021   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     Allergies:   Ethanol, Alcohol, Azithromycin, Lisinopril, Tramadol hcl, Cephalexin, and Neomycin   Social History   Socioeconomic History   Marital status: Married    Spouse name: Not on file   Number of children: Not on file   Years of education: Not on file   Highest  education level: Not on file  Occupational History   Not on file  Tobacco Use   Smoking status: Former    Packs/day: 2.00    Years: 36.00    Pack years: 72.00    Types: Cigarettes    Quit date: 10/14/1988    Years since quitting: 33.1   Smokeless tobacco: Former    Quit date: 04/09/1988  Vaping Use   Vaping Use: Never used  Substance and Sexual Activity   Alcohol use: No    Alcohol/week: 0.0 standard drinks   Drug use: Not Currently   Sexual activity: Not on file  Other Topics Concern   Not on file  Social History Narrative   Not on file   Social Determinants of Health   Financial  Resource Strain: Not on file  Food Insecurity: Not on file  Transportation Needs: Not on file  Physical Activity: Not on file  Stress: Not on file  Social Connections: Not on file     Family History: The patient's family history includes Brain cancer in his mother; CAD in his father; Heart disease in his father and mother; Lung cancer in his father; Prostate cancer in his father; Thyroid disease in his sister. ROS:   Please see the history of present illness.    All 14 point review of systems negative except as described per history of present illness  EKGs/Labs/Other Studies Reviewed:      Recent Labs: No results found for requested labs within last 8760 hours.  Recent Lipid Panel    Component Value Date/Time   CHOL 112 06/09/2018 1052   TRIG 208 (H) 06/09/2018 1052   HDL 27 (L) 06/09/2018 1052   CHOLHDL 4.1 06/09/2018 1052   LDLCALC 43 06/09/2018 1052    Physical Exam:    VS:  There were no vitals taken for this visit.    Wt Readings from Last 3 Encounters:  07/10/21 139 lb (63 kg)  05/30/21 140 lb 9.6 oz (63.8 kg)  01/12/21 144 lb (65.3 kg)     GEN:  Well nourished, well developed in no acute distress HEENT: Normal NECK: No JVD; No carotid bruits LYMPHATICS: No lymphadenopathy CARDIAC: RRR, no murmurs, no rubs, no gallops RESPIRATORY:  Clear to auscultation without rales, wheezing or rhonchi  ABDOMEN: Soft, non-tender, non-distended MUSCULOSKELETAL:  No edema; No deformity  SKIN: Warm and dry LOWER EXTREMITIES: no swelling NEUROLOGIC:  Alert and oriented x 3 PSYCHIATRIC:  Normal affect   ASSESSMENT:    1. Old MI (myocardial infarction)   2. Coronary artery disease of native artery of native heart with stable angina pectoris ([place])   3. Essential hypertension   4. Aneurysm of ascending aorta without rupture   5. Mixed hyperlipidemia    PLAN:    In order of problems listed above:  Coronary artery disease stable from that point reviewed he did have  fairly typical symptoms before however we were successful in managing this with medication he is asymptomatic but at the same time he admits that he does not do much exercises. Ascending aortic aneurysm that be followed by CT surgeons.  He is scheduled to have another CT done.  If he required surgery we need to evaluate him from heart point of view before that surgery. Essential hypertension blood pressure well controlled continue present management. Dyslipidemia: He is on high intense statin form of Crestor 20 I did review K PN which only his total cholesterol 106 HDL 29.  We will call primary care physician  to get full cholesterol profile.   Medication Adjustments/Labs and Tests Ordered: Current medicines are reviewed at length with the patient today.  Concerns regarding medicines are outlined above.  No orders of the defined types were placed in this encounter.  Medication changes: No orders of the defined types were placed in this encounter.   Signed, Drew Liter, MD, [facility] 12/10/2021 1:21 PM    [facility]

## 2021-12-14 DIAGNOSIS — Z23 Encounter for immunization: Secondary | ICD-10-CM | POA: Diagnosis not present

## 2021-12-14 DIAGNOSIS — D510 Vitamin B12 deficiency anemia due to intrinsic factor deficiency: Secondary | ICD-10-CM | POA: Diagnosis not present

## 2021-12-19 ENCOUNTER — Other Ambulatory Visit: Payer: Self-pay | Admitting: Cardiology

## 2021-12-19 DIAGNOSIS — H5203 Hypermetropia, bilateral: Secondary | ICD-10-CM | POA: Diagnosis not present

## 2021-12-19 DIAGNOSIS — H524 Presbyopia: Secondary | ICD-10-CM | POA: Diagnosis not present

## 2021-12-19 DIAGNOSIS — I10 Essential (primary) hypertension: Secondary | ICD-10-CM | POA: Diagnosis not present

## 2021-12-19 DIAGNOSIS — H52223 Regular astigmatism, bilateral: Secondary | ICD-10-CM | POA: Diagnosis not present

## 2021-12-19 DIAGNOSIS — E119 Type 2 diabetes mellitus without complications: Secondary | ICD-10-CM | POA: Diagnosis not present

## 2021-12-19 DIAGNOSIS — H353111 Nonexudative age-related macular degeneration, right eye, early dry stage: Secondary | ICD-10-CM | POA: Diagnosis not present

## 2021-12-19 DIAGNOSIS — Z7984 Long term (current) use of oral hypoglycemic drugs: Secondary | ICD-10-CM | POA: Diagnosis not present

## 2021-12-19 DIAGNOSIS — H25813 Combined forms of age-related cataract, bilateral: Secondary | ICD-10-CM | POA: Diagnosis not present

## 2021-12-27 ENCOUNTER — Inpatient Hospital Stay: Admission: RE | Admit: 2021-12-27 | Payer: PPO | Source: Ambulatory Visit

## 2021-12-27 ENCOUNTER — Ambulatory Visit
Admission: RE | Admit: 2021-12-27 | Discharge: 2021-12-27 | Disposition: A | Discharge status: Home / Self Care | Payer: PPO | Source: Ambulatory Visit | Attending: Thoracic Surgery (Cardiothoracic Vascular Surgery) | Admitting: Thoracic Surgery (Cardiothoracic Vascular Surgery)

## 2021-12-27 DIAGNOSIS — I712 Thoracic aortic aneurysm, without rupture, unspecified: Secondary | ICD-10-CM

## 2021-12-27 MED ORDER — IOPAMIDOL (ISOVUE-370) INJECTION 76%
75.0000 mL | Freq: Once | INTRAVENOUS | Status: AC | PRN
  Administered 2021-12-27: 75 mL via INTRAVENOUS

## 2022-01-01 ENCOUNTER — Ambulatory Visit: Payer: PPO | Admitting: Thoracic Surgery (Cardiothoracic Vascular Surgery)

## 2022-01-01 ENCOUNTER — Other Ambulatory Visit: Payer: Self-pay

## 2022-01-01 ENCOUNTER — Encounter: Payer: Self-pay | Admitting: Thoracic Surgery (Cardiothoracic Vascular Surgery)

## 2022-01-01 VITALS — BP 142/82 | HR 60 | Resp 20 | Ht 67.0 in | Wt 141.0 lb

## 2022-01-01 DIAGNOSIS — I712 Thoracic aortic aneurysm, without rupture, unspecified: Secondary | ICD-10-CM

## 2022-01-01 NOTE — Progress Notes (Signed)
? ?   ?[address] ?      [place] [address] ?            [phone]   ? ? ?HPI: Mr. Grissinger returns for follow-up of his ascending aneurysm ? ?Vadim Centola is a very nice 81 year old gentleman with a history of hypertension, AAA, stent graft, thoracic aortic atherosclerosis, ascending and descending thoracic aortic aneurysms, remote tobacco abuse, COPD, and a lingular resection for lung cancer in 2014.  He was first noted to have a 5 cm ascending aneurysm in 2014.  He has been followed since that time. ? ?He was last seen in the office in September by Ellwood Handler.  His aneurysm was stable at that time.  He did not have a CT angiogram because he just recently had a CT of the chest. ? ?In the interim since his last visit he has been feeling well.  He denies chest pain, pressure, tightness, shortness of breath with exertion.  He does have occasional sporadic pain in the left upper chest/shoulder area.  This is not associated with exertion or any particular movement. ? ?Past Medical History:  ?Diagnosis Date  ? Acquired trigger finger of right middle finger 03/23/2020  ? Allergic rhinitis 01/11/2021  ? Aneurysm of thoracic aorta   ? Aortic aneurysm ([place])   ? Asthma 10/15/2016  ? Atherosclerotic heart disease of native coronary artery without angina pectoris 04/22/2018  ? Barrett's esophagus   ? Benign essential hypertension 04/22/2018  ? Bilateral sensorineural hearing loss 01/11/2021  ? Cancer [facility]) 2011  ? lung  ? Chronic ischemic heart disease 04/22/2018  ? Chronic obstructive lung disease ([place])   ? COPD (chronic obstructive pulmonary disease) ([place])   ? 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 Coronary artery disease 10/15/2016  ? Diabetes type 2, uncontrolled 04/22/2018  ? Dyspnea 03/01/2015  ? Followed in Pulmonary clinic/ [facility]/ [place] - Labs 12/29/14 nl cbc, tsh, bmet  -  03/01/2015  Walked RA x 3 laps @ 185 ft each stopped due to  End of study, no sob or desat - pfts 03/01/2015  Restrictive  only with ERV 36% >> rec try off spiriva    ? Esophageal reflux   ? History of lung cancer 09/02/2017  ? History of lung cancer 09/02/2017  ? Hydrocele   ? Hypercholesteremia 10/15/2016  ? Hyperlipidemia   ? Hypertension   ? Insomnia 01/11/2021  ? Ischemic heart disease   ? Low back pain 01/11/2021  ? Neck pain 01/11/2021  ? 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 Pain in right hand 03/23/2020  ? Peripheral vascular disease ([place])   ? Renal artery stenosis ([place]) 10/15/2016  ? Sleep disorder 04/22/2018  ? Thoracic aortic aneurysm without mention of rupture 2011  ? Type 2 diabetes mellitus ([place]) 01/11/2021  ? ? ?Current Outpatient Medications  ?Medication Sig Dispense Refill  ? Acetaminophen (ACETAMINOPHEN EXTRA STRENGTH) 500 MG capsule Take 1 capsule by mouth at bedtime.    ? aspirin 81 MG tablet Take 81 mg by mouth daily.    ? budesonide-formoterol (SYMBICORT) 80-4.5 MCG/ACT inhaler Inhale 2 puffs into the lungs 2 (two) times daily.    ? clopidogrel (PLAVIX) 75 MG tablet Take 75 mg by mouth daily.    ? DULoxetine (CYMBALTA) 60 MG capsule Take 60 mg by mouth daily.    ? famotidine (PEPCID) 10 MG tablet Take 20 mg by mouth at bedtime.  3  ? finasteride (  PROPECIA) 1 MG tablet Take 5 mg by mouth daily.    ? Fluticasone-Salmeterol (WIXELA INHUB IN) Inhale 1 puff into the lungs daily.    ? loratadine (CLARITIN) 10 MG tablet Take 10 mg by mouth daily as needed for allergies.    ? losartan (COZAAR) 100 MG tablet Take 25 mg by mouth daily.    ? metFORMIN (GLUCOPHAGE) 500 MG tablet Take 500 mg by mouth 2 (two) times daily.    ? metoprolol (TOPROL-XL) 200 MG 24 hr tablet Take 100 mg by mouth daily.    ? nitroGLYCERIN (NITROSTAT) 0.4 MG SL tablet Place 0.4 mg under the tongue every 5 (five) minutes as needed for chest pain.    ? Omega-3 Fatty Acids (FISH OIL) 1000 MG CAPS Take 1 tablet by mouth 2 (two) times daily.    ? pantoprazole (PROTONIX) 40 MG tablet Take 40 mg by mouth daily.    ? ranolazine  (RANEXA) 500 MG 12 hr tablet Take 1 tablet by mouth twice daily 180 tablet 2  ? Simethicone (GAS-X PO) Take 1 tablet by mouth at bedtime.    ? traZODone (DESYREL) 100 MG tablet Take 100 mg by mouth at bedtime.    ? rosuvastatin (CRESTOR) 20 MG tablet Take 1 tablet (20 mg total) by mouth daily. (Patient not taking: Reported on 12/10/2021) 90 tablet 3  ? ?No current facility-administered medications for this visit.  ? ? ?Physical Exam ?BP (!) 142/82 (BP Location: Right Arm, Patient Position: Sitting)   Pulse 60   Resp 20   Ht 5\' 7"  (1.702 m)   Wt 141 lb (64 kg)   SpO2 93% Comment: RA  BMI 22.64 kg/m?  ?81 year old man in no acute distress ?Alert and oriented x3 with no focal deficits ?Lungs clear bilaterally ?No carotid bruits ?Cardiac regular rate and rhythm with a faint systolic murmur ?No peripheral edema ? ?Diagnostic Tests: ?CT ANGIOGRAPHY CHEST WITH CONTRAST ?  ?TECHNIQUE: ?Multidetector CT imaging of the chest was performed using the ?standard protocol during bolus administration of intravenous ?contrast. Multiplanar CT image reconstructions and MIPs were ?obtained to evaluate the vascular anatomy. ?  ?RADIATION DOSE REDUCTION: This exam was performed according to the ?departmental dose-optimization program which includes automated ?exposure control, adjustment of the mA and/or kV according to ?patient size and/or use of iterative reconstruction technique. ?  ?CONTRAST:  74mL ISOVUE-370 IOPAMIDOL (ISOVUE-370) INJECTION 76% ?  ?COMPARISON:  03/04/2021 the ?  ?FINDINGS: ?Cardiovascular: ?  ?At the level of the sinuses of Valsalva the aorta measures 4 cm, ?image 63/10. Unchanged. ?  ?The ascending thoracic aorta is stable measuring 5 cm, image 57/10. ?  ?At the level of the aortic arch the abdominal aorta measures 2.9 cm, ?image 102/16. stable from previous exam. ?  ?The descending thoracic aorta measures 4.8 cm, image 103/16. ?Formally 4.6 cm. ?  ?At the level of the hiatus the aorta measures 3.3 cm, image  92/16. ?Unchanged when compared with the previous exam. ?  ?Aortic atherosclerotic calcifications are again noted. Coronary ?artery calcifications are also identified. No signs of pericardial ?effusion. Heart size is normal. ?  ?Mediastinum/Nodes: No enlarged mediastinal, hilar, or axillary lymph ?nodes. Thyroid gland, trachea, and esophagus demonstrate no ?significant findings. ?  ?Lungs/Pleura: No pleural effusion. No airspace consolidation, ?atelectasis, or pneumothorax. Postop change from left lower ?lobectomy. Unchanged millimetric subpleural nodules in the right ?middle lobe compatible with a benign process. ?  ?Upper Abdomen: Status post stent graft repair of the abdominal ?aorta. No acute abnormality  identified within the imaged portions of ?the upper abdomen. ?  ?Musculoskeletal: No chest wall abnormality. No acute or significant ?osseous findings. ?  ?Review of the MIP images confirms the above findings. ?  ?IMPRESSION: ?1. Stable aneurysmal dilatation of the ascending thoracic aorta ?measuring 5 cm. Recommend semi-annual imaging followup by CTA or MRA ?and referral to cardiothoracic surgery if not already obtained. This ?recommendation follows 2010 ?ACCF/AHA/AATS/ACR/ASA/SCA/SCAI/SIR/STS/SVM Guidelines for the ?Diagnosis and Management of Patients With Thoracic Aortic Disease. ?Circulation. 2010; 121: J031-Y81. Aortic aneurysm NOS (ICD10-I71.9) ?2. Status post stent graft repair of abdominal aorta. ?3. Coronary artery calcifications. ?4. Aortic Atherosclerosis (ICD10-I70.0). ?  ?  ?Electronically Signed ?  By: Kerby Moors M.D. ?  On: 12/27/2021 11:43 ?  ?I personally reviewed the CT images.  There is thoracic aortic and coronary atherosclerosis, stable ascending aneurysm with bovine arch.  Aorta returns to normal size between origin of the carotids and the left subclavian. ? ?Impression: ?Pellegrino Kennard is a very nice 81 year old gentleman with a history of hypertension, AAA, stent graft, thoracic  aortic atherosclerosis, ascending and descending thoracic aortic aneurysms, remote tobacco abuse, COPD, and a lingular resection for lung cancer in 2014.  He was first noted to have a 5 cm ascending aneurysm in 201

## 2022-01-16 DIAGNOSIS — D510 Vitamin B12 deficiency anemia due to intrinsic factor deficiency: Secondary | ICD-10-CM | POA: Diagnosis not present

## 2022-02-12 DIAGNOSIS — D510 Vitamin B12 deficiency anemia due to intrinsic factor deficiency: Secondary | ICD-10-CM | POA: Diagnosis not present

## 2022-03-05 DIAGNOSIS — Z0000 Encounter for general adult medical examination without abnormal findings: Secondary | ICD-10-CM | POA: Diagnosis not present

## 2022-03-05 DIAGNOSIS — E7439 Other disorders of intestinal carbohydrate absorption: Secondary | ICD-10-CM | POA: Diagnosis not present

## 2022-03-05 DIAGNOSIS — Z79899 Other long term (current) drug therapy: Secondary | ICD-10-CM | POA: Diagnosis not present

## 2022-03-05 DIAGNOSIS — E7800 Pure hypercholesterolemia, unspecified: Secondary | ICD-10-CM | POA: Diagnosis not present

## 2022-03-05 DIAGNOSIS — N529 Male erectile dysfunction, unspecified: Secondary | ICD-10-CM | POA: Diagnosis not present

## 2022-03-05 DIAGNOSIS — Z6821 Body mass index (BMI) 21.0-21.9, adult: Secondary | ICD-10-CM | POA: Diagnosis not present

## 2022-03-05 DIAGNOSIS — R413 Other amnesia: Secondary | ICD-10-CM | POA: Diagnosis not present

## 2022-03-05 DIAGNOSIS — M25511 Pain in right shoulder: Secondary | ICD-10-CM | POA: Diagnosis not present

## 2022-03-20 DIAGNOSIS — D510 Vitamin B12 deficiency anemia due to intrinsic factor deficiency: Secondary | ICD-10-CM | POA: Diagnosis not present

## 2022-04-09 DIAGNOSIS — D510 Vitamin B12 deficiency anemia due to intrinsic factor deficiency: Secondary | ICD-10-CM | POA: Diagnosis not present

## 2022-05-15 DIAGNOSIS — D510 Vitamin B12 deficiency anemia due to intrinsic factor deficiency: Secondary | ICD-10-CM | POA: Diagnosis not present

## 2022-05-17 ENCOUNTER — Other Ambulatory Visit: Payer: Self-pay | Admitting: *Deleted

## 2022-05-17 DIAGNOSIS — I7121 Aneurysm of the ascending aorta, without rupture: Secondary | ICD-10-CM

## 2022-06-07 DIAGNOSIS — L84 Corns and callosities: Secondary | ICD-10-CM | POA: Insufficient documentation

## 2022-06-11 ENCOUNTER — Encounter: Payer: Self-pay | Admitting: Cardiology

## 2022-06-11 ENCOUNTER — Ambulatory Visit: Payer: PPO | Attending: Cardiology | Admitting: Cardiology

## 2022-06-11 ENCOUNTER — Other Ambulatory Visit: Payer: Self-pay

## 2022-06-11 ENCOUNTER — Other Ambulatory Visit (HOSPITAL_COMMUNITY): Payer: Self-pay

## 2022-06-11 VITALS — BP 114/82 | HR 60 | Ht 66.0 in | Wt 143.2 lb

## 2022-06-11 DIAGNOSIS — I7121 Aneurysm of the ascending aorta, without rupture: Secondary | ICD-10-CM

## 2022-06-11 DIAGNOSIS — I25118 Atherosclerotic heart disease of native coronary artery with other forms of angina pectoris: Secondary | ICD-10-CM | POA: Diagnosis not present

## 2022-06-11 DIAGNOSIS — I10 Essential (primary) hypertension: Secondary | ICD-10-CM

## 2022-06-11 NOTE — Progress Notes (Unsigned)
Cardiology Office Note:    Date:  06/11/2022   ID:  [name], DOB 11/09/1940, MRN [id number]  PCP:  Angelina Sheriff, MD  Cardiologist:  Jenne Campus, MD    Referring MD: Angelina Sheriff, MD   Chief Complaint  Patient presents with   Follow-up         History of Present Illness:    Drew [name] is a 81 y.o. male with past medical history significant for coronary artery disease, status post PTCA and stenting of the right coronary artery done many years ago after that he got mildly diminished left ventricle ejection fraction with inferior wall hypokinesis latest estimation of his coronary status was done and stress test in December 2021 showing no evidence of ischemia he also got ascending aortic aneurysm followed by CT surgery in [facility] however he does not see me for well additional issue involved his carotid arteries as well as essential hypertension dyslipidemia and diabetes He comes today 2 months of follow-up overall he is doing well.  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   Cancer ([place]) 2011   lung   Chronic ischemic heart disease 04/22/2018   Chronic obstructive lung disease ([place])    COPD (chronic obstructive pulmonary disease) ([place])    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   Coronary artery disease 10/15/2016   Diabetes type 2, uncontrolled 04/22/2018   Dyspnea 03/01/2015   Followed in Pulmonary clinic/ [facility]/ [place] - Labs 12/29/14 nl cbc, tsh, bmet  -   03/01/2015  Walked RA x 3 laps @ 185 ft each stopped due to  End of study, no sob or desat - pfts 03/01/2015  Restrictive only with ERV 36% >> rec try off spiriva     Esophageal reflux    History of lung cancer 09/02/2017   History of lung cancer 09/02/2017   Hydrocele    Hypercholesteremia 10/15/2016   Hyperlipidemia    Hypertension    Insomnia 01/11/2021   Ischemic heart disease    Low back pain 01/11/2021   Neck pain 01/11/2021   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   Pain in right hand 03/23/2020   Peripheral vascular disease ([place])    Renal artery stenosis ([place]) 10/15/2016   Sleep disorder 04/22/2018   Thoracic aortic aneurysm without mention of rupture 2011   Type 2 diabetes mellitus ([place]) 01/11/2021    Past Surgical History:  Procedure Laterality Date   ABDOMINAL AORTIC ANEURYSM REPAIR W/ ENDOLUMINAL GRAFT  1999   CORONARY ARTERY BYPASS GRAFT     CORONARY STENT PLACEMENT  2003   left lower lobectomy  2012    Current Medications: Current Meds  Medication Sig   Acetaminophen (ACETAMINOPHEN EXTRA STRENGTH) 500 MG capsule Take 1 capsule by mouth at bedtime.   aspirin 81 MG tablet Take 81 mg by mouth daily.   budesonide-formoterol (SYMBICORT) 80-4.5 MCG/ACT inhaler Inhale 2 puffs into the lungs 2 (two) times daily.   clopidogrel (PLAVIX) 75 MG tablet Take  75 mg by mouth daily.   DULoxetine (CYMBALTA) 60 MG capsule Take 60 mg by mouth daily.   famotidine (PEPCID) 10 MG tablet Take 20 mg by mouth at bedtime.   finasteride (PROPECIA) 1 MG tablet Take 5 mg by mouth daily.   Fluticasone-Salmeterol (WIXELA INHUB IN) Inhale 1 puff into the lungs daily.   loratadine (CLARITIN) 10 MG tablet Take 10 mg by mouth daily as needed for allergies.   losartan (COZAAR) 100 MG tablet Take 25 mg by mouth daily.   metFORMIN (GLUCOPHAGE) 500 MG tablet Take 500 mg by mouth 2 (two) times daily.   metoprolol (TOPROL-XL) 200 MG 24 hr tablet Take 100 mg by mouth daily.    nitroGLYCERIN (NITROSTAT) 0.4 MG SL tablet Place 0.4 mg under the tongue every 5 (five) minutes as needed for chest pain.   Omega-3 Fatty Acids (FISH OIL) 1000 MG CAPS Take 1 tablet by mouth 2 (two) times daily.   pantoprazole (PROTONIX) 40 MG tablet Take 40 mg by mouth daily.   ranolazine (RANEXA) 500 MG 12 hr tablet Take 1 tablet by mouth twice daily (Patient taking differently: Take 500 mg by mouth 2 (two) times daily.)   rosuvastatin (CRESTOR) 20 MG tablet Take 1 tablet (20 mg total) by mouth daily.   Simethicone (GAS-X PO) Take 1 tablet by mouth at bedtime.   traZODone (DESYREL) 100 MG tablet Take 100 mg by mouth at bedtime.     Allergies:   Ethanol, Alcohol, Azithromycin, Lisinopril, Tramadol hcl, Cephalexin, and Neomycin   Social History   Socioeconomic History   Marital status: Married    Spouse name: Not on file   Number of children: Not on file   Years of education: Not on file   Highest education level: Not on file  Occupational History   Not on file  Tobacco Use   Smoking status: Former    Packs/day: 2.00    Years: 36.00    Total pack years: 72.00    Types: Cigarettes    Quit date: 10/14/1988    Years since quitting: 33.6   Smokeless tobacco: Former    Quit date: 04/09/1988  Vaping Use   Vaping Use: Never used  Substance and Sexual Activity   Alcohol use: No    Alcohol/week: 0.0 standard drinks of alcohol   Drug use: Not Currently   Sexual activity: Not on file  Other Topics Concern   Not on file  Social History Narrative   Not on file   Social Determinants of Health   Financial Resource Strain: Not on file  Food Insecurity: Not on file  Transportation Needs: Not on file  Physical Activity: Not on file  Stress: Not on file  Social Connections: Not on file     Family History: The patient's family history includes Brain cancer in his mother; CAD in his father; Heart disease in his father and mother; Lung cancer in his father; Prostate cancer in his father;  Thyroid disease in his sister. ROS:   Please see the history of present illness.    All 14 point review of systems negative except as described per history of present illness  EKGs/Labs/Other Studies Reviewed:      Recent Labs: No results found for requested labs within last 365 days.  Recent Lipid Panel    Component Value Date/Time   CHOL 112 06/09/2018 1052   TRIG 208 (H) 06/09/2018 1052   HDL 27 (L) 06/09/2018 1052   CHOLHDL 4.1 06/09/2018 1052  [place] 43 06/09/2018 1052    Physical Exam:    VS:  BP 114/82 (BP Location: Left Arm, Patient Position: Sitting)   Pulse 60   Ht 5\' 6"  (1.676 m)   Wt 143 lb 3.2 oz (65 kg)   SpO2 94%   BMI 23.11 kg/m     Wt Readings from Last 3 Encounters:  06/11/22 143 lb 3.2 oz (65 kg)  01/01/22 141 lb (64 kg)  12/10/21 141 lb (64 kg)     GEN:  Well nourished, well developed in no acute distress HEENT: Normal NECK: No JVD; No carotid bruits LYMPHATICS: No lymphadenopathy CARDIAC: RRR, no murmurs, no rubs, no gallops RESPIRATORY:  Clear to auscultation without rales, wheezing or rhonchi  ABDOMEN: Soft, non-tender, non-distended MUSCULOSKELETAL:  No edema; No deformity  SKIN: Warm and dry LOWER EXTREMITIES: no swelling NEUROLOGIC:  Alert and oriented x 3 PSYCHIATRIC:  Normal affect   ASSESSMENT:    1. Coronary artery disease of native artery of native heart with stable angina pectoris ([place])   2. Essential hypertension   3. Aneurysm of ascending aorta without rupture (HCC)    PLAN:    In order of problems listed above:  Coronary disease stable from that point review on appropriate medications to include antiplatelets therapy Dyslipidemia I did review his K PN which show me total cholesterol of 104 with HDL of 28.  He is on antiplatelet therapy as well as Crestor 20 which I will continue Carotic arterial disease plan to repeat his carotic ultrasound Ascending arctic aneurysm we will do echocardiogram to assess  that   Medication Adjustments/Labs and Tests Ordered: Current medicines are reviewed at length with the patient today.  Concerns regarding medicines are outlined above.  No orders of the defined types were placed in this encounter.  Medication changes: No orders of the defined types were placed in this encounter.   Signed, Park Liter, MD, [facility] 06/11/2022 2:28 PM    [place]

## 2022-06-11 NOTE — Patient Instructions (Signed)
Medication Instructions:  Your physician recommends that you continue on your current medications as directed. Please refer to the Current Medication list given to you today.  *If you need a refill on your cardiac medications before your next appointment, please call your pharmacy*   Lab Work: None Ordered If you have labs (blood work) drawn today and your tests are completely normal, you will receive your results only by: [place] (if you have MyChart) OR A paper copy in the mail If you have any lab test that is abnormal or we need to change your treatment, we will call you to review the results.   Testing/Procedures: Your physician has requested that you have a carotid duplex. This test is an ultrasound of the carotid arteries in your neck. It looks at blood flow through these arteries that supply the brain with blood. Allow one hour for this exam. There are no restrictions or special instructions.   Your physician has requested that you have an echocardiogram. Echocardiography is a painless test that uses sound waves to create images of your heart. It provides your doctor with information about the size and shape of your heart and how well your heart's chambers and valves are working. This procedure takes approximately one hour. There are no restrictions for this procedure.    Follow-Up: At [facility], you and your health needs are our priority.  As part of our continuing mission to provide you with exceptional heart care, we have created designated Provider Care Teams.  These Care Teams include your primary Cardiologist (physician) and Advanced Practice Providers (APPs -  Physician Assistants and Nurse Practitioners) who all work together to provide you with the care you need, when you need it.  We recommend signing up for the patient portal called "MyChart".  Sign up information is provided on this After Visit Summary.  MyChart is used to connect with patients for Virtual Visits  (Telemedicine).  Patients are able to view lab/test results, encounter notes, upcoming appointments, etc.  Non-urgent messages can be sent to your provider as well.   To learn more about what you can do with MyChart, go to [email or web address].    Your next appointment:   6 month(s)  The format for your next appointment:   In Person  Provider:   Jenne Campus, MD    Other Instructions NA

## 2022-06-19 DIAGNOSIS — D510 Vitamin B12 deficiency anemia due to intrinsic factor deficiency: Secondary | ICD-10-CM | POA: Diagnosis not present

## 2022-07-02 ENCOUNTER — Encounter: Payer: PPO | Admitting: Thoracic Surgery (Cardiothoracic Vascular Surgery)

## 2022-07-02 ENCOUNTER — Other Ambulatory Visit: Payer: PPO

## 2022-07-11 DIAGNOSIS — R600 Localized edema: Secondary | ICD-10-CM | POA: Diagnosis not present

## 2022-07-11 DIAGNOSIS — N644 Mastodynia: Secondary | ICD-10-CM | POA: Diagnosis not present

## 2022-07-11 DIAGNOSIS — Z6822 Body mass index (BMI) 22.0-22.9, adult: Secondary | ICD-10-CM | POA: Diagnosis not present

## 2022-07-16 ENCOUNTER — Ambulatory Visit: Payer: PPO | Attending: Cardiology

## 2022-07-16 DIAGNOSIS — I25118 Atherosclerotic heart disease of native coronary artery with other forms of angina pectoris: Secondary | ICD-10-CM

## 2022-07-16 DIAGNOSIS — I6521 Occlusion and stenosis of right carotid artery: Secondary | ICD-10-CM

## 2022-07-17 ENCOUNTER — Ambulatory Visit: Payer: PPO | Attending: Cardiology

## 2022-07-17 DIAGNOSIS — Z23 Encounter for immunization: Secondary | ICD-10-CM | POA: Diagnosis not present

## 2022-07-19 ENCOUNTER — Telehealth: Payer: Self-pay

## 2022-07-19 NOTE — Telephone Encounter (Signed)
LVM regarding normal results per DPR- Per Dr. Wendy Poet note. Routed to PCP.

## 2022-07-23 ENCOUNTER — Encounter: Payer: PPO | Admitting: Thoracic Surgery (Cardiothoracic Vascular Surgery)

## 2022-07-30 ENCOUNTER — Ambulatory Visit: Payer: PPO | Admitting: Thoracic Surgery (Cardiothoracic Vascular Surgery)

## 2022-07-30 ENCOUNTER — Encounter: Payer: Self-pay | Admitting: Thoracic Surgery (Cardiothoracic Vascular Surgery)

## 2022-07-30 ENCOUNTER — Ambulatory Visit
Admission: RE | Admit: 2022-07-30 | Discharge: 2022-07-30 | Disposition: A | Discharge status: Home / Self Care | Payer: PPO | Source: Ambulatory Visit | Attending: Thoracic Surgery (Cardiothoracic Vascular Surgery) | Admitting: Thoracic Surgery (Cardiothoracic Vascular Surgery)

## 2022-07-30 VITALS — BP 122/54 | HR 56 | Resp 20 | Ht 66.0 in | Wt 144.1 lb

## 2022-07-30 DIAGNOSIS — I712 Thoracic aortic aneurysm, without rupture, unspecified: Secondary | ICD-10-CM | POA: Diagnosis not present

## 2022-07-30 DIAGNOSIS — Q2546 Tortuous aortic arch: Secondary | ICD-10-CM | POA: Diagnosis not present

## 2022-07-30 DIAGNOSIS — I771 Stricture of artery: Secondary | ICD-10-CM | POA: Diagnosis not present

## 2022-07-30 DIAGNOSIS — I7121 Aneurysm of the ascending aorta, without rupture: Secondary | ICD-10-CM

## 2022-07-30 DIAGNOSIS — I7123 Aneurysm of the descending thoracic aorta, without rupture: Secondary | ICD-10-CM | POA: Diagnosis not present

## 2022-07-30 DIAGNOSIS — I700 Atherosclerosis of aorta: Secondary | ICD-10-CM | POA: Diagnosis not present

## 2022-07-30 MED ORDER — IOPAMIDOL (ISOVUE-370) INJECTION 76%
75.0000 mL | Freq: Once | INTRAVENOUS | Status: AC | PRN
  Administered 2022-07-30: 75 mL via INTRAVENOUS

## 2022-07-30 NOTE — Progress Notes (Signed)
[address]       [place] [address]             [phone]     HPI: Drew [name] returns for follow-up of his ascending and descending thoracic aortic aneurysms.  Drew [name] is an 81 year old gentleman with a history of hypertension, abdominal aortic aneurysm, stent graft, thoracic aortic atherosclerosis, ascending and descending thoracic aortic aneurysms, remote tobacco abuse, COPD, and a lingular resection for lung cancer in 2014.  He was first noted to have an ascending aneurysm in 2014.  It was 5 cm.  He has been followed since then.  I last saw him in the office in March.  He was doing well at that time.  His aneurysm was unchanged.  He has been feeling well.  He is not having any chest pain, pressure, tightness, or unusual shortness of breath with exertion.  He does get some swelling in his feet and ankles when he sits still for long periods of time.  Recently had an ultrasound of his carotid arteries.  Past Medical History:  Diagnosis Date   Acquired trigger finger of right middle finger 03/23/2020   Allergic rhinitis 01/11/2021   Aneurysm of thoracic aorta (HCC)    Aortic aneurysm ([place])    Asthma 10/15/2016   Atherosclerotic heart disease of native coronary artery without angina pectoris 04/22/2018   Barrett's esophagus    Benign essential hypertension 04/22/2018   Bilateral sensorineural hearing loss 01/11/2021   Cancer ([place]) 2011   lung   Chronic ischemic heart disease 04/22/2018   Chronic obstructive lung disease (HCC)    COPD (chronic obstructive pulmonary disease) ([place])    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   Coronary artery disease 10/15/2016   Diabetes type 2, uncontrolled 04/22/2018   Dyspnea 03/01/2015   Followed in Pulmonary clinic/ [facility]/ [place] - Labs 12/29/14 nl cbc, tsh, bmet  -  03/01/2015  Walked RA x 3 laps @ 185 ft each stopped due to  End of study, no sob or desat - pfts 03/01/2015  Restrictive only with ERV 36% >> rec try  off spiriva     Esophageal reflux    History of lung cancer 09/02/2017   History of lung cancer 09/02/2017   Hydrocele    Hypercholesteremia 10/15/2016   Hyperlipidemia    Hypertension    Insomnia 01/11/2021   Ischemic heart disease    Low back pain 01/11/2021   Neck pain 01/11/2021   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     aspirin 81 MG tablet Take 81 mg by mouth daily.     budesonide-formoterol (SYMBICORT) 80-4.5 MCG/ACT inhaler Inhale 2 puffs into the lungs 2 (two) times daily.     clopidogrel (PLAVIX) 75 MG tablet Take 75 mg by mouth daily.     DULoxetine (CYMBALTA) 60 MG capsule Take 60 mg by mouth daily.     famotidine (PEPCID) 10 MG tablet Take 20 mg by mouth at bedtime.  3   finasteride (PROPECIA) 1 MG tablet Take 5  mg by mouth daily.     Fluticasone-Salmeterol (WIXELA INHUB IN) Inhale 1 puff into the lungs daily.     loratadine (CLARITIN) 10 MG tablet Take 10 mg by mouth daily as needed for allergies.     losartan (COZAAR) 100 MG tablet Take 25 mg by mouth daily.     metFORMIN (GLUCOPHAGE) 500 MG tablet Take 500 mg by mouth 2 (two) times daily.     metoprolol (TOPROL-XL) 200 MG 24 hr tablet Take 100 mg by mouth daily.     nitroGLYCERIN (NITROSTAT) 0.4 MG SL tablet Place 0.4 mg under the tongue every 5 (five) minutes as needed for chest pain.     Omega-3 Fatty Acids (FISH OIL) 1000 MG CAPS Take 1 tablet by mouth 2 (two) times daily.     pantoprazole (PROTONIX) 40 MG tablet Take 40 mg by mouth daily.     ranolazine (RANEXA) 500 MG 12 hr tablet Take 1  tablet by mouth twice daily (Patient taking differently: Take 500 mg by mouth 2 (two) times daily.) 180 tablet 2   Simethicone (GAS-X PO) Take 1 tablet by mouth at bedtime.     traZODone (DESYREL) 100 MG tablet Take 100 mg by mouth at bedtime.     rosuvastatin (CRESTOR) 20 MG tablet Take 1 tablet (20 mg total) by mouth daily. 90 tablet 3   No current facility-administered medications for this visit.    Physical Exam BP (!) 122/54 (BP Location: Left Arm, Patient Position: Sitting, Cuff Size: Normal)   Pulse (!) 56   Resp 20   Ht 5\' 6"  (1.676 m)   Wt 144 lb 1.6 oz (65.4 kg)   SpO2 93% Comment: RA  BMI 23.47 kg/m  81 year old man in no acute distress Alert and oriented x3 with no focal deficits Right carotid bruit Cardiac regular rate and rhythm normal S1 and S2 no murmur Lungs clear with equal breath sounds bilaterally No peripheral edema  Diagnostic Tests: CT ANGIOGRAPHY CHEST WITH CONTRAST   TECHNIQUE: Multidetector CT imaging of the chest was performed using the standard protocol during bolus administration of intravenous contrast. Multiplanar CT image reconstructions and MIPs were obtained to evaluate the vascular anatomy.   RADIATION DOSE REDUCTION: This exam was performed according to the departmental dose-optimization program which includes automated exposure control, adjustment of the mA and/or kV according to patient size and/or use of iterative reconstruction technique.   CONTRAST:  76mL ISOVUE-370 IOPAMIDOL (ISOVUE-370) INJECTION 76%   COMPARISON:  December 27, 2021   FINDINGS: Cardiovascular: Ascending thoracic aorta is stable at 5.1 x 5.1 cm. Descending thoracic aorta aneurysmal in the proximal descending thoracic aorta at 5.0 x 5.1 cm also unchanged. Mixing artifact within the thoracic aorta in the descending segment similar to prior imaging. No acute aortic process. Bovine arch configuration with tortuosity of great vessels in the chest aortic atherosclerosis  and atherosclerotic changes of branch vessels in the chest.   Heart size is stable without pericardial effusion or nodularity. Central pulmonary vasculature not well evaluated but of normal caliber.   Mediastinum/Nodes: No thoracic inlet, axillary, mediastinal or hilar adenopathy. Esophagus grossly normal.   Lungs/Pleura: Post partial lung resection in the LEFT chest similar to prior imaging.   3 mm RIGHT middle lobe pulmonary nodule along the pleural surface of the RIGHT middle lobe and adjacent 2 mm RIGHT middle lobe pulmonary nodule (image 91 and 89 respectively are not changed compared to previous imaging. No pleural effusion. No dense consolidative changes. Airways are patent.  Upper Abdomen: Renal cortical scarring worse on the LEFT, incompletely evaluated. No acute or suspicious upper abdominal findings. Signs of aortic endo grafting incompletely evaluated, unchanged with respect imaged portions.   Musculoskeletal: No chest wall abnormality. No acute or significant osseous findings. Spinal degenerative changes.   Review of the MIP images confirms the above findings.   IMPRESSION: 1. Stable aneurysmal dilation of the ascending and descending thoracic aorta. No acute aortic process. Aneurysmal caliber at 5 cm of the ascending thoracic aorta and descending thoracic aorta also with 5 cm caliber. Recommend semi-annual imaging followup by CTA or MRA and referral to cardiothoracic surgery if not already obtained. This recommendation follows 2010 ACCF/AHA/AATS/ACR/ASA/SCA/SCAI/SIR/STS/SVM Guidelines for the Diagnosis and Management of Patients With Thoracic Aortic Disease. Circulation. 2010; 121: [id number]. Aortic aneurysm NOS (ICD10-I71.9) 2. Post partial lung resection in the LEFT chest similar to prior imaging. 3. Small RIGHT middle lobe pulmonary nodules are not changed compared to previous imaging. Considered benign based on stability since 2019. [employer] society  guidelines do not apply in this patient with history of LEFT lung cancer. 4. Aortic endografting incompletely evaluated, unchanged with respect imaged portions in the upper abdomen. 5. Renal cortical scarring worse on the LEFT, incompletely evaluated. 6. Aortic atherosclerosis.   Aortic Atherosclerosis (ICD10-I70.0).     Electronically Signed   By: Zetta Bills M.D.   On: 07/30/2022 09:49 I personally reviewed the CT images.  He has extensive thoracic aortic atherosclerotic disease.  5.1 cm ascending aneurysm and 5 cm descending aneurysm.  Unchanged from March 2023.  No change in small right middle lobe nodules.  Impression: Drew [name] is an 81 year old gentleman with a history of hypertension, abdominal aortic aneurysm, stent graft, thoracic aortic atherosclerosis, ascending and descending thoracic aortic aneurysms, remote tobacco abuse, COPD, and a lingular resection for lung cancer in 2014.  Thoracic aortic atherosclerosis/ascending aortic aneurysm/descending aortic aneurysm.  Ascending and descending aneurysms in the setting of generalized aortomegaly.  No interval change.  Have been stable over time.  He continued semiannual follow-up.  Hypertension-blood pressure well controlled on current regimen.  CAD-no symptoms currently.  Followed by Dr. Agustin Cree  Right carotid bruit-duplex showed 40 to 59% stenosis in right carotid.  Plan: Return in 6 months with CT angio of chest  Melrose Nakayama, MD [facility] [phone]

## 2022-08-14 DIAGNOSIS — D510 Vitamin B12 deficiency anemia due to intrinsic factor deficiency: Secondary | ICD-10-CM | POA: Diagnosis not present

## 2022-08-27 ENCOUNTER — Encounter: Payer: Self-pay | Admitting: Cardiology

## 2022-09-12 ENCOUNTER — Other Ambulatory Visit: Payer: Self-pay | Admitting: Cardiology

## 2022-09-23 DIAGNOSIS — R1310 Dysphagia, unspecified: Secondary | ICD-10-CM | POA: Diagnosis not present

## 2022-09-23 DIAGNOSIS — K219 Gastro-esophageal reflux disease without esophagitis: Secondary | ICD-10-CM | POA: Diagnosis not present

## 2022-09-24 ENCOUNTER — Ambulatory Visit: Payer: PPO | Attending: Cardiology

## 2022-09-25 DIAGNOSIS — D510 Vitamin B12 deficiency anemia due to intrinsic factor deficiency: Secondary | ICD-10-CM | POA: Diagnosis not present

## 2022-09-27 ENCOUNTER — Encounter: Payer: Self-pay | Admitting: Cardiology

## 2022-10-25 DIAGNOSIS — D510 Vitamin B12 deficiency anemia due to intrinsic factor deficiency: Secondary | ICD-10-CM | POA: Diagnosis not present

## 2022-11-12 ENCOUNTER — Other Ambulatory Visit: Payer: PPO

## 2022-11-12 ENCOUNTER — Ambulatory Visit: Payer: PPO | Attending: Cardiology

## 2022-11-12 DIAGNOSIS — I7121 Aneurysm of the ascending aorta, without rupture: Secondary | ICD-10-CM | POA: Diagnosis not present

## 2022-11-12 LAB — ECHOCARDIOGRAM COMPLETE
Area-P 1/2: 2.73 cm2
Calc EF: 44.4 %
P 1/2 time: 691 msec
S' Lateral: 5 cm
Single Plane A2C EF: 38.9 %
Single Plane A4C EF: 47.2 %

## 2022-11-18 ENCOUNTER — Other Ambulatory Visit: Payer: Self-pay

## 2022-11-18 ENCOUNTER — Telehealth: Payer: Self-pay

## 2022-11-18 DIAGNOSIS — I10 Essential (primary) hypertension: Secondary | ICD-10-CM

## 2022-11-18 MED ORDER — RANOLAZINE ER 500 MG PO TB12: 500.0000 mg | ORAL_TABLET | Freq: Two times a day (BID) | ORAL | 3 refills | Status: DC

## 2022-11-18 NOTE — Telephone Encounter (Signed)
Results reviewed with pt's Drew [name]- per DPR- as per Dr. Wendy Poet note. Daughter agreed to have pt get BW and then start Entresto.  Pt verbalized understanding and had no additional questions. Routed to PCP

## 2022-11-20 DIAGNOSIS — D510 Vitamin B12 deficiency anemia due to intrinsic factor deficiency: Secondary | ICD-10-CM | POA: Diagnosis not present

## 2022-11-20 DIAGNOSIS — I10 Essential (primary) hypertension: Secondary | ICD-10-CM | POA: Diagnosis not present

## 2022-11-20 LAB — BASIC METABOLIC PANEL
BUN/Creatinine Ratio: 21 (ref 10–24)
BUN: 18 mg/dL (ref 8–27)
CO2: 24 mmol/L (ref 20–29)
Calcium: 9.5 mg/dL (ref 8.6–10.2)
Chloride: 102 mmol/L (ref 96–106)
Creatinine, Ser: 0.84 mg/dL (ref 0.76–1.27)
Glucose: 118 mg/dL — ABNORMAL HIGH (ref 70–99)
Potassium: 4.9 mmol/L (ref 3.5–5.2)
Sodium: 141 mmol/L (ref 134–144)
eGFR: 88 mL/min/{1.73_m2} (ref >59)

## 2022-12-03 ENCOUNTER — Telehealth: Payer: Self-pay | Admitting: Cardiology

## 2022-12-03 DIAGNOSIS — Z79899 Other long term (current) drug therapy: Secondary | ICD-10-CM

## 2022-12-03 DIAGNOSIS — I10 Essential (primary) hypertension: Secondary | ICD-10-CM

## 2022-12-03 NOTE — Telephone Encounter (Signed)
Left VM for [name] to callback.

## 2022-12-03 NOTE — Telephone Encounter (Signed)
Daughter calling in bout patients labs. Please advise

## 2022-12-05 ENCOUNTER — Other Ambulatory Visit: Payer: Self-pay

## 2022-12-05 NOTE — Telephone Encounter (Signed)
Daughter is calling back. Please advise  Correct number should be [phone]

## 2022-12-05 NOTE — Telephone Encounter (Signed)
Called the patient's daughter and informed her of the patient's lab results. Patient's daughter stated that if the kidney function on the blood work was normal he was going to be started on [place]. Did you still want to start him on Entresto and if so what dosage and frequency.

## 2022-12-09 MED ORDER — ENTRESTO 24-26 MG PO TABS: 1.0000 | ORAL_TABLET | Freq: Two times a day (BID) | ORAL | 2 refills | Status: DC

## 2022-12-09 NOTE — Addendum Note (Signed)
Addended by: Darrel Reach on: 12/09/2022 09:54 AM   Modules accepted: Orders

## 2022-12-09 NOTE — Telephone Encounter (Signed)
S/w Bary Leriche, notified of the following per Dr. Raliegh Ip, agreed with plan. Lab order on file, medication sent to confirmed pharmacy.

## 2022-12-16 ENCOUNTER — Ambulatory Visit: Payer: PPO | Admitting: Cardiology

## 2022-12-17 DIAGNOSIS — D519 Vitamin B12 deficiency anemia, unspecified: Secondary | ICD-10-CM | POA: Diagnosis not present

## 2022-12-23 ENCOUNTER — Other Ambulatory Visit: Payer: Self-pay | Admitting: Thoracic Surgery (Cardiothoracic Vascular Surgery)

## 2022-12-23 DIAGNOSIS — I7121 Aneurysm of the ascending aorta, without rupture: Secondary | ICD-10-CM

## 2022-12-25 DIAGNOSIS — Z79899 Other long term (current) drug therapy: Secondary | ICD-10-CM | POA: Diagnosis not present

## 2022-12-25 DIAGNOSIS — I10 Essential (primary) hypertension: Secondary | ICD-10-CM | POA: Diagnosis not present

## 2022-12-26 LAB — BASIC METABOLIC PANEL
BUN/Creatinine Ratio: 21 (ref 10–24)
BUN: 16 mg/dL (ref 8–27)
CO2: 26 mmol/L (ref 20–29)
Calcium: 9.8 mg/dL (ref 8.6–10.2)
Chloride: 101 mmol/L (ref 96–106)
Creatinine, Ser: 0.78 mg/dL (ref 0.76–1.27)
Glucose: 126 mg/dL — ABNORMAL HIGH (ref 70–99)
Potassium: 4.9 mmol/L (ref 3.5–5.2)
Sodium: 139 mmol/L (ref 134–144)
eGFR: 90 mL/min/{1.73_m2} (ref >59)

## 2022-12-31 ENCOUNTER — Telehealth: Payer: Self-pay

## 2022-12-31 NOTE — Telephone Encounter (Signed)
-----   Message from Park Liter, MD sent at 12/26/2022  9:25 PM EDT ----- Chem-7 looks good

## 2022-12-31 NOTE — Telephone Encounter (Signed)
Patient notified of results.

## 2023-01-14 DIAGNOSIS — D510 Vitamin B12 deficiency anemia due to intrinsic factor deficiency: Secondary | ICD-10-CM | POA: Diagnosis not present

## 2023-02-04 ENCOUNTER — Encounter: Payer: Self-pay | Admitting: Thoracic Surgery (Cardiothoracic Vascular Surgery)

## 2023-02-04 ENCOUNTER — Ambulatory Visit: Payer: PPO | Admitting: Thoracic Surgery (Cardiothoracic Vascular Surgery)

## 2023-02-04 ENCOUNTER — Ambulatory Visit
Admission: RE | Admit: 2023-02-04 | Discharge: 2023-02-04 | Disposition: A | Discharge status: Home / Self Care | Payer: PPO | Source: Ambulatory Visit | Attending: Thoracic Surgery (Cardiothoracic Vascular Surgery) | Admitting: Thoracic Surgery (Cardiothoracic Vascular Surgery)

## 2023-02-04 VITALS — BP 113/65 | HR 64 | Resp 20 | Ht 66.0 in | Wt 148.0 lb

## 2023-02-04 DIAGNOSIS — I7121 Aneurysm of the ascending aorta, without rupture: Secondary | ICD-10-CM

## 2023-02-04 DIAGNOSIS — I517 Cardiomegaly: Secondary | ICD-10-CM | POA: Diagnosis not present

## 2023-02-04 DIAGNOSIS — Z85118 Personal history of other malignant neoplasm of bronchus and lung: Secondary | ICD-10-CM | POA: Diagnosis not present

## 2023-02-04 DIAGNOSIS — I712 Thoracic aortic aneurysm, without rupture, unspecified: Secondary | ICD-10-CM | POA: Diagnosis not present

## 2023-02-04 DIAGNOSIS — I2510 Atherosclerotic heart disease of native coronary artery without angina pectoris: Secondary | ICD-10-CM | POA: Diagnosis not present

## 2023-02-04 MED ORDER — IOPAMIDOL (ISOVUE-370) INJECTION 76%
75.0000 mL | Freq: Once | INTRAVENOUS | Status: AC | PRN
  Administered 2023-02-04: 75 mL via INTRAVENOUS

## 2023-02-04 NOTE — Progress Notes (Signed)
[address]       [name] [address]             [phone]     HPI: Drew [name] returns for a scheduled follow-up regarding his thoracic aortic aneurysms.  Drew [name] is an 82 year old man with a history of hypertension, abdominal aortic aneurysm, stent graft, thoracic aortic atherosclerosis, ascending and descending thoracic aneurysms, remote tobacco use, COPD, and lingular resection for lung cancer in 2014.  First noted to have an ascending aneurysm in 2014.  It was 5 cm.  He has been followed since then.  I last saw him in the office in October.  He was doing well at that time.  In the interim since that visit he has continued to do well.  No chest pain, pressure, tightness, shortness of breath, claudication.  Feels well and remains fairly active.  Past Medical History:  Diagnosis Date   Acquired trigger finger of right middle finger 03/23/2020   Allergic rhinitis 01/11/2021   Aneurysm of thoracic aorta    Aortic aneurysm    Asthma 10/15/2016   Atherosclerotic heart disease of native coronary artery without angina pectoris 04/22/2018   Barrett's esophagus    Benign essential hypertension 04/22/2018   Bilateral sensorineural hearing loss 01/11/2021   Cancer 2011   lung   Chronic ischemic heart disease 04/22/2018   Chronic obstructive lung disease    COPD (chronic obstructive pulmonary disease)    COPD mixed type 01/27/2015   Coronary artery disease 10/15/2016   Diabetes type 2, uncontrolled 04/22/2018   Dyspnea 03/01/2015   Followed in Pulmonary clinic/ [facility]/ [facility] - Labs 12/29/14 nl cbc, tsh, bmet  -  03/01/2015  Walked RA x 3 laps @ 185 ft each stopped due to  End of study, no sob or desat - pfts 03/01/2015  Restrictive only with ERV 36% >> rec try off spiriva     Esophageal reflux    History of lung cancer 09/02/2017   History of lung cancer 09/02/2017   Hydrocele    Hypercholesteremia 10/15/2016   Hyperlipidemia    Hypertension     Insomnia 01/11/2021   Ischemic heart disease    Low back pain 01/11/2021   Neck pain 01/11/2021   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     aspirin 81 MG tablet Take 81 mg by mouth daily.     budesonide-formoterol (SYMBICORT) 80-4.5 MCG/ACT inhaler Inhale 2 puffs into the lungs 2 (two) times daily.     clopidogrel (PLAVIX) 75 MG tablet Take 75 mg by mouth daily.     DULoxetine (CYMBALTA) 60 MG capsule Take 60 mg by mouth daily.     famotidine (PEPCID) 10 MG tablet Take 20 mg by mouth at bedtime.  3   finasteride (PROPECIA) 1 MG tablet Take 5 mg by mouth daily.     Fluticasone-Salmeterol (WIXELA INHUB IN) Inhale 1 puff into the lungs daily.     loratadine (CLARITIN) 10 MG tablet Take 10 mg by mouth daily as needed for allergies.  metFORMIN (GLUCOPHAGE) 500 MG tablet Take 500 mg by mouth 2 (two) times daily.     metoprolol (TOPROL-XL) 200 MG 24 hr tablet Take 100 mg by mouth daily.     nitroGLYCERIN (NITROSTAT) 0.4 MG SL tablet Place 0.4 mg under the tongue every 5 (five) minutes as needed for chest pain.     Omega-3 Fatty Acids (FISH OIL) 1000 MG CAPS Take 1 tablet by mouth 2 (two) times daily.     pantoprazole (PROTONIX) 40 MG tablet Take 40 mg by mouth daily.     ranolazine (RANEXA) 500 MG 12 hr tablet Take 1 tablet (500 mg total) by mouth 2 (two) times daily. 180 tablet 3   sacubitril-valsartan (ENTRESTO) 24-26 MG Take 1 tablet by mouth 2 (two) times daily. 60 tablet 2   Simethicone (GAS-X PO) Take 1 tablet by mouth at bedtime.     traZODone (DESYREL) 100 MG tablet Take 100  mg by mouth at bedtime.     rosuvastatin (CRESTOR) 20 MG tablet Take 1 tablet (20 mg total) by mouth daily. 90 tablet 3   No current facility-administered medications for this visit.    Physical Exam BP 113/65   Pulse 64   Resp 20   Ht  (1.676 m)   Wt 148 lb (67.1 kg)   SpO2 93% Comment: RA  BMI 23.37 kg/m  82 year old man in no acute distress Alert and oriented x 3 with no focal deficits No carotid bruits Cardiac regular rate and rhythm with no murmur Lungs clear bilaterally No peripheral edema  Diagnostic Tests: CT ANGIOGRAPHY CHEST WITH CONTRAST   TECHNIQUE: Multidetector CT imaging of the chest was performed using the standard protocol during bolus administration of intravenous contrast. Multiplanar CT image reconstructions and MIPs were obtained to evaluate the vascular anatomy.   RADIATION DOSE REDUCTION: This exam was performed according to the departmental dose-optimization program which includes automated exposure control, adjustment of the mA and/or kV according to patient size and/or use of iterative reconstruction technique.   CONTRAST:  75mL ISOVUE-370 IOPAMIDOL (ISOVUE-370) INJECTION 76%   COMPARISON:  July 30, 2022.   FINDINGS: Cardiovascular: 5.3 cm ascending thoracic aortic aneurysm is noted which is slightly enlarged compared to prior exam. 5.5 cm proximal descending thoracic aortic aneurysm is noted which is slightly enlarged compared to prior exam where it measured 5.3 cm based on my own measurement. No dissection is noted. Mild cardiomegaly is noted. No pericardial effusion. Coronary artery calcifications are noted.   Mediastinum/Nodes: No enlarged mediastinal, hilar, or axillary lymph nodes. Thyroid gland, trachea, and esophagus demonstrate no significant findings.   Lungs/Pleura: No pneumothorax or pleural effusion is noted. Status post partial left-sided lung resection. No acute pulmonary disease is noted. Stable subpleural nodules  are noted in right middle lobe which can be considered benign at this point with no further follow-up required.   Upper Abdomen: No acute abnormality.   Musculoskeletal: No chest wall abnormality. No acute or significant osseous findings.   Review of the MIP images confirms the above findings.   IMPRESSION: 5.3 cm ascending thoracic aortic aneurysm which is slightly enlarged compared to prior exam. 5.5 cm proximal descending thoracic aortic aneurysm which is slightly enlarged compared to prior exam. Recommend semi-annual imaging followup by CTA or MRA and referral to cardiothoracic surgery if not already obtained. This recommendation follows 2010 ACCF/AHA/AATS/ACR/ASA/SCA/SCAI/SIR/STS/SVM Guidelines for the Diagnosis and Management of Patients With Thoracic Aortic Disease. Circulation. 2010; 121: E266-e369TAA. Aortic aneurysm NOS (ICD10-I71.9). These results will be called  to the ordering clinician or representative by the Radiologist Assistant, and communication documented in the PACS or zVision Dashboard.   Aortic Atherosclerosis (ICD10-I70.0).     Electronically Signed   By: Lupita Raider M.D.   On: 02/04/2023 12:17 I personally reviewed the CT images.  Slight difference in measurement techniques compared to previous readings.  Ascending aneurysm 5.3 cm.  Descending aorta diffusely aneurysmal.   One area on sagittal imaging where there is a more significant bulge measured 5.4 cm.  (Radiology measured 5.5)   Impression: Drew [name] is an 82 year old man with a history of hypertension, abdominal aortic aneurysm, stent graft, thoracic aortic atherosclerosis, ascending and descending thoracic aneurysms, remote tobacco use, COPD, and lingular resection for lung cancer in 2014.  Ascending thoracic aneurysm-measures about 5.2 to 5.3 cm.  Slight increase from previous but really minimal increase dating back to 2014.  Descending aneurysm-overall descending aorta relatively  similar however there is 1 area of where there is a more distinct bulge in the aneurysm that measures about 5.4 to 5.5 cm.  Looking back a couple of years that has grown in size slowly over time.  Not much change over the past 6 months.  May require surgical intervention in the not too distant future.  Will refer to Dr. Myra Gianotti [facility] to get his opinion.  Hypertension-blood pressure well-controlled on current regimen. Plan: Will refer to Dr. Myra Gianotti [facility] to get his input on the potential for stent grafting of the descending aneurysm.  May not need to be done immediately but likely will at some point. Return in 6 months with CT angio of chest  Loreli Slot, MD [facility] [phone]

## 2023-02-10 ENCOUNTER — Encounter: Payer: Self-pay | Admitting: Cardiology

## 2023-02-10 ENCOUNTER — Ambulatory Visit: Payer: PPO | Attending: Cardiology | Admitting: Cardiology

## 2023-02-10 VITALS — BP 114/80 | HR 71 | Ht 67.0 in | Wt 146.2 lb

## 2023-02-10 DIAGNOSIS — I25118 Atherosclerotic heart disease of native coronary artery with other forms of angina pectoris: Secondary | ICD-10-CM | POA: Diagnosis not present

## 2023-02-10 DIAGNOSIS — I10 Essential (primary) hypertension: Secondary | ICD-10-CM

## 2023-02-10 DIAGNOSIS — E782 Mixed hyperlipidemia: Secondary | ICD-10-CM | POA: Diagnosis not present

## 2023-02-10 NOTE — Patient Instructions (Signed)
Medication Instructions:  Your physician recommends that you continue on your current medications as directed. Please refer to the Current Medication list given to you today.  *If you need a refill on your cardiac medications before your next appointment, please call your pharmacy*   Lab Work: Chem 7, Lipid- today If you have labs (blood work) drawn today and your tests are completely normal, you will receive your results only by: MyChart Message (if you have MyChart) OR A paper copy in the mail If you have any lab test that is abnormal or we need to change your treatment, we will call you to review the results.   Testing/Procedures: None Ordered   Follow-Up: At [facility], you and your health needs are our priority.  As part of our continuing mission to provide you with exceptional heart care, we have created designated Provider Care Teams.  These Care Teams include your primary Cardiologist (physician) and Advanced Practice Providers (APPs -  Physician Assistants and Nurse Practitioners) who all work together to provide you with the care you need, when you need it.  We recommend signing up for the patient portal called "MyChart".  Sign up information is provided on this After Visit Summary.  MyChart is used to connect with patients for Virtual Visits (Telemedicine).  Patients are able to view lab/test results, encounter notes, upcoming appointments, etc.  Non-urgent messages can be sent to your provider as well.   To learn more about what you can do with MyChart, go to [email or web address].    Your next appointment:   6 month(s)  The format for your next appointment:   In Person  Provider:   Gypsy Balsam, MD    Other Instructions NA

## 2023-02-10 NOTE — Progress Notes (Signed)
Cardiology Office Note:    Date:  02/10/2023   ID:  Drew [name], DOB December 26, 1940, MRN [id number]  PCP:  Noni Saupe, MD  Cardiologist:  Gypsy Balsam, MD    Referring MD: Noni Saupe, MD   Chief Complaint  Patient presents with   Follow-up  Doing fine  History of Present Illness:    Drew [name] is a 82 y.o. male past medical history significant for coronary artery disease, status post PCI stenting of the right coronary artery done many years ago, mildly diminished ejection fraction with mid inferior wall hypokinesis, last evaluation of CAD was done and stress test in December 2021 which showed no evidence of ischemia also thoracic aorta aneurysm follow-up with vascular surgeons.  Latest diameter 5.4 cm. Comes today to months for follow-up overall doing well cardiac wise.  Denies any chest pain tightness squeezing pressure burning chest.  He admits however he does not do much she sits and watches TV one of my questioning him about exercises he tells me he worked for 6 years time to relax.  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   Coronary artery disease 10/15/2016   Diabetes type 2, uncontrolled 04/22/2018   Dyspnea 03/01/2015   Followed in Pulmonary clinic/ [facility]/ [facility] - Labs 12/29/14 nl cbc, tsh, bmet  -  03/01/2015  Walked RA x 3 laps @ 185 ft each stopped due to  End of  study, no sob or desat - pfts 03/01/2015  Restrictive only with ERV 36% >> rec try off spiriva     Esophageal reflux    History of lung cancer 09/02/2017   History of lung cancer 09/02/2017   Hydrocele    Hypercholesteremia 10/15/2016   Hyperlipidemia    Hypertension    Insomnia 01/11/2021   Ischemic heart disease    Low back pain 01/11/2021   Neck pain 01/11/2021   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   Pain in right hand 03/23/2020   Peripheral vascular disease (HCC)    Renal artery stenosis (HCC) 10/15/2016   Sleep disorder 04/22/2018   Thoracic aortic aneurysm without mention of rupture 2011   Type 2 diabetes mellitus (HCC) 01/11/2021    Past Surgical History:  Procedure Laterality Date   ABDOMINAL AORTIC ANEURYSM REPAIR W/ ENDOLUMINAL GRAFT  1999   CORONARY ARTERY BYPASS GRAFT     CORONARY STENT PLACEMENT  2003   left lower lobectomy  2012    Current Medications: Current Meds  Medication Sig   Acetaminophen (ACETAMINOPHEN EXTRA STRENGTH) 500 MG capsule Take 1 capsule by mouth at bedtime.   aspirin 81 MG tablet Take 81 mg by mouth daily.   budesonide-formoterol (SYMBICORT) 80-4.5 MCG/ACT inhaler Inhale 2 puffs into the lungs 2 (two) times daily.   clopidogrel (PLAVIX) 75 MG tablet Take 75 mg by mouth daily.   DULoxetine (CYMBALTA)  60 MG capsule Take 60 mg by mouth daily.   famotidine (PEPCID) 10 MG tablet Take 20 mg by mouth at bedtime.   finasteride (PROPECIA) 1 MG tablet Take 5 mg by mouth daily.   Fluticasone-Salmeterol (WIXELA INHUB IN) Inhale 1 puff into the lungs daily.   loratadine (CLARITIN) 10 MG tablet Take 10 mg by mouth daily as needed for allergies.   metFORMIN (GLUCOPHAGE) 500 MG tablet Take 500 mg by mouth 2 (two) times daily.   metoprolol (TOPROL-XL) 200 MG 24 hr tablet Take 100 mg by mouth daily.   nitroGLYCERIN (NITROSTAT) 0.4 MG SL tablet Place 0.4 mg under the tongue every 5 (five) minutes as needed for chest pain.   Omega-3  Fatty Acids (FISH OIL) 1000 MG CAPS Take 1 tablet by mouth 2 (two) times daily.   pantoprazole (PROTONIX) 40 MG tablet Take 40 mg by mouth daily.   ranolazine (RANEXA) 500 MG 12 hr tablet Take 1 tablet (500 mg total) by mouth 2 (two) times daily.   rosuvastatin (CRESTOR) 20 MG tablet Take 1 tablet (20 mg total) by mouth daily.   sacubitril-valsartan (ENTRESTO) 24-26 MG Take 1 tablet by mouth 2 (two) times daily.   Simethicone (GAS-X PO) Take 1 tablet by mouth at bedtime.   traZODone (DESYREL) 100 MG tablet Take 100 mg by mouth at bedtime.     Allergies:   Ethanol, Alcohol, Azithromycin, Lisinopril, Tramadol hcl, Cephalexin, and Neomycin   Social History   Socioeconomic History   Marital status: Married    Spouse name: Not on file   Number of children: Not on file   Years of education: Not on file   Highest education level: Not on file  Occupational History   Not on file  Tobacco Use   Smoking status: Former    Packs/day: 2.00    Years: 36.00    Additional pack years: 0.00    Total pack years: 72.00    Types: Cigarettes    Quit date: 10/14/1988    Years since quitting: 34.3   Smokeless tobacco: Former    Quit date: 04/09/1988  Vaping Use   Vaping Use: Never used  Substance and Sexual Activity   Alcohol use: No    Alcohol/week: 0.0 standard drinks of alcohol   Drug use: Not Currently   Sexual activity: Not on file  Other Topics Concern   Not on file  Social History Narrative   Not on file   Social Determinants of Health   Financial Resource Strain: Not on file  Food Insecurity: Not on file  Transportation Needs: Not on file  Physical Activity: Not on file  Stress: Not on file  Social Connections: Not on file     Family History: The patient's family history includes Brain cancer in his mother; CAD in his father; Heart disease in his father and mother; Lung cancer in his father; Prostate cancer in his father; Thyroid disease in his sister. ROS:   Please see the  history of present illness.    All 14 point review of systems negative except as described per history of present illness  EKGs/Labs/Other Studies Reviewed:      Recent Labs: 12/25/2022: BUN 16; Creatinine, Ser 0.78; Potassium 4.9; Sodium 139  Recent Lipid Panel    Component Value Date/Time   CHOL 112 06/09/2018 1052   TRIG 208 (H) 06/09/2018 1052   HDL 27 (L) 06/09/2018 1052   CHOLHDL 4.1 06/09/2018 1052   LDLCALC 43 06/09/2018 1052  Physical Exam:    VS:  BP 114/80 (BP Location: Left Arm, Patient Position: Sitting)   Pulse 71   Ht 5\' 7"  (1.702 m)   Wt 146 lb 3.2 oz (66.3 kg)   SpO2 98%   BMI 22.90 kg/m     Wt Readings from Last 3 Encounters:  02/10/23 146 lb 3.2 oz (66.3 kg)  02/04/23 148 lb (67.1 kg)  07/30/22 144 lb 1.6 oz (65.4 kg)     GEN:  Well nourished, well developed in no acute distress HEENT: Normal NECK: No JVD; No carotid bruits LYMPHATICS: No lymphadenopathy CARDIAC: RRR, no murmurs, no rubs, no gallops RESPIRATORY:  Clear to auscultation without rales, wheezing or rhonchi  ABDOMEN: Soft, non-tender, non-distended MUSCULOSKELETAL:  No edema; No deformity  SKIN: Warm and dry LOWER EXTREMITIES: no swelling NEUROLOGIC:  Alert and oriented x 3 PSYCHIATRIC:  Normal affect   ASSESSMENT:    1. Coronary artery disease of native artery of native heart with stable angina pectoris (HCC)   2. Benign essential hypertension   3. Mixed hyperlipidemia    PLAN:    In order of problems listed above:  Coronary disease stable from that point.  Denies having the symptoms we will continue present management. Ascending aortic aneurysm.  With ascending arctic aneurysm that being follow-up by vascular surgeons.  Apparently supposed to have some consultation regarding potential stenting. Mixed dyslipidemia I did review K PN which show me data from 2023 with total cholesterol 104 HDL 28, he is taking Crestor 20 which I will continue. 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  Concerns regarding medicines are outlined above.  No orders of the defined types were placed in this encounter.  Medication changes: No orders of the defined types were placed in this encounter.   Signed, Georgeanna Lea, MD, [facility] 02/10/2023 2:30 PM    [facility]

## 2023-02-10 NOTE — Addendum Note (Signed)
Addended by: Baldo Ash D on: 02/10/2023 02:39 PM   Modules accepted: Orders

## 2023-02-11 LAB — LIPID PANEL
Chol/HDL Ratio: 3.6 ratio (ref 0.0–5.0)
Cholesterol, Total: 137 mg/dL (ref 100–199)
HDL: 38 mg/dL — ABNORMAL LOW (ref >39)
LDL Chol Calc (NIH): 76 mg/dL (ref 0–99)
Triglycerides: 127 mg/dL (ref 0–149)
VLDL Cholesterol Cal: 23 mg/dL (ref 5–40)

## 2023-02-11 LAB — BASIC METABOLIC PANEL
BUN/Creatinine Ratio: 17 (ref 10–24)
BUN: 13 mg/dL (ref 8–27)
CO2: 24 mmol/L (ref 20–29)
Calcium: 9.5 mg/dL (ref 8.6–10.2)
Chloride: 99 mmol/L (ref 96–106)
Creatinine, Ser: 0.77 mg/dL (ref 0.76–1.27)
Glucose: 105 mg/dL — ABNORMAL HIGH (ref 70–99)
Potassium: 4.9 mmol/L (ref 3.5–5.2)
Sodium: 138 mmol/L (ref 134–144)
eGFR: 90 mL/min/{1.73_m2} (ref >59)

## 2023-02-13 DIAGNOSIS — D519 Vitamin B12 deficiency anemia, unspecified: Secondary | ICD-10-CM | POA: Diagnosis not present

## 2023-02-14 ENCOUNTER — Telehealth: Payer: Self-pay

## 2023-02-14 NOTE — Telephone Encounter (Signed)
Spoke to [employer], notified of results.

## 2023-02-14 NOTE — Telephone Encounter (Signed)
-----   Message from Georgeanna Lea, MD sent at 02/13/2023  8:35 AM EDT ----- Labs are looking good, continue present management

## 2023-03-06 ENCOUNTER — Other Ambulatory Visit: Payer: Self-pay | Admitting: Cardiology

## 2023-03-06 NOTE — Telephone Encounter (Signed)
Rx sent to pharmacy   

## 2023-03-21 DIAGNOSIS — D519 Vitamin B12 deficiency anemia, unspecified: Secondary | ICD-10-CM | POA: Diagnosis not present

## 2023-04-30 DIAGNOSIS — Z6823 Body mass index (BMI) 23.0-23.9, adult: Secondary | ICD-10-CM | POA: Diagnosis not present

## 2023-04-30 DIAGNOSIS — M25512 Pain in left shoulder: Secondary | ICD-10-CM | POA: Diagnosis not present

## 2023-05-16 DIAGNOSIS — D519 Vitamin B12 deficiency anemia, unspecified: Secondary | ICD-10-CM | POA: Diagnosis not present

## 2023-05-27 ENCOUNTER — Other Ambulatory Visit: Payer: Self-pay | Admitting: Cardiology

## 2023-05-28 ENCOUNTER — Telehealth: Payer: Self-pay | Admitting: Cardiology

## 2023-05-28 MED ORDER — ENTRESTO 24-26 MG PO TABS: 1.0000 | ORAL_TABLET | Freq: Two times a day (BID) | ORAL | 0 refills | Status: DC

## 2023-05-28 NOTE — Telephone Encounter (Signed)
Sent 30 day supply to [facility] on [address], [place].

## 2023-05-28 NOTE — Telephone Encounter (Signed)
*  STAT* If patient is at the pharmacy, call can be transferred to refill team.   1. Which medications need to be refilled? (please list name of each medication and dose if known)   sacubitril-valsartan (ENTRESTO) 24-26 MG     2. Would you like to learn more about the convenience, safety, & potential cost savings by using the [facility]? No   3. Are you open to using the [facility] ([facility]No   4. Which pharmacy/location (including street and city if local pharmacy) is medication to be sent to? [id number] - [place], [place] - [address] AT [address]     5. Do they need a 30 day or 90 day supply? 30 day   Pt's daughter would like only a 30 day supply instead of the 90 day supply that was sent yesterday. Please advise

## 2023-05-28 NOTE — Addendum Note (Signed)
Addended by: Makaiah Terwilliger, Elmarie Shiley L on: 05/28/2023 03:41 PM   Modules accepted: Orders

## 2023-06-04 ENCOUNTER — Other Ambulatory Visit: Payer: Self-pay | Admitting: Cardiology

## 2023-06-05 DIAGNOSIS — M25612 Stiffness of left shoulder, not elsewhere classified: Secondary | ICD-10-CM | POA: Diagnosis not present

## 2023-06-05 DIAGNOSIS — R293 Abnormal posture: Secondary | ICD-10-CM | POA: Diagnosis not present

## 2023-06-05 DIAGNOSIS — M25611 Stiffness of right shoulder, not elsewhere classified: Secondary | ICD-10-CM | POA: Diagnosis not present

## 2023-06-05 DIAGNOSIS — M25512 Pain in left shoulder: Secondary | ICD-10-CM | POA: Diagnosis not present

## 2023-06-05 DIAGNOSIS — M25511 Pain in right shoulder: Secondary | ICD-10-CM | POA: Diagnosis not present

## 2023-06-09 ENCOUNTER — Telehealth: Payer: Self-pay | Admitting: Cardiology

## 2023-06-09 MED ORDER — RANOLAZINE ER 500 MG PO TB12: 500.0000 mg | ORAL_TABLET | Freq: Two times a day (BID) | ORAL | 2 refills | Status: DC

## 2023-06-09 NOTE — Telephone Encounter (Signed)
 *  STAT* If patient is at the pharmacy, call can be transferred to refill team.   1. Which medications need to be refilled? (please list name of each medication and dose if known)   ranolazine (RANEXA) 500 MG 12 hr tablet     2. Would you like to learn more about the convenience, safety, & potential cost savings by using the [facility]? No      3. Are you open to using the [facility] ([facility]. No    4. Which pharmacy/location (including street and city if local pharmacy) is medication to be sent to?[facility] - [place], [place] - [address]    5. Do they need a 30 day or 90 day supply? 90 days

## 2023-06-17 DIAGNOSIS — M25512 Pain in left shoulder: Secondary | ICD-10-CM | POA: Diagnosis not present

## 2023-06-25 ENCOUNTER — Other Ambulatory Visit: Payer: Self-pay | Admitting: Thoracic Surgery (Cardiothoracic Vascular Surgery)

## 2023-06-25 DIAGNOSIS — I7121 Aneurysm of the ascending aorta, without rupture: Secondary | ICD-10-CM

## 2023-07-03 ENCOUNTER — Telehealth: Payer: Self-pay

## 2023-07-03 ENCOUNTER — Telehealth: Payer: Self-pay | Admitting: Cardiology

## 2023-07-03 MED ORDER — ENTRESTO 24-26 MG PO TABS: 1.0000 | ORAL_TABLET | Freq: Two times a day (BID) | ORAL | 3 refills | Status: DC

## 2023-07-03 NOTE — Telephone Encounter (Signed)
Pt c/o medication issue:  1. Name of Medication: sacubitril-valsartan (ENTRESTO) 24-26 MG   2. How are you currently taking this medication (dosage and times per day)?  Take 1 tablet by mouth 2 (two) times daily.       3. Are you having a reaction (difficulty breathing--STAT)? No  4. What is your medication issue? Pt's daughter is requesting a callback regarding pt being in donut hole and now this medication is 164.00. She stated they'd like to speak about other options like may another medication or a prescription card and maybe even having it sent to [place] to get it cheaper. Please advise

## 2023-07-03 NOTE — Telephone Encounter (Signed)
Pts daughter per DPR called asking if pts Rx for Entresto coud be sent to the [place] in [place].RX sent.

## 2023-07-08 ENCOUNTER — Other Ambulatory Visit: Payer: Self-pay

## 2023-07-08 MED ORDER — RANOLAZINE ER 500 MG PO TB12: 500.0000 mg | ORAL_TABLET | Freq: Two times a day (BID) | ORAL | 3 refills | Status: DC

## 2023-07-08 MED ORDER — ENTRESTO 24-26 MG PO TABS: 1.0000 | ORAL_TABLET | Freq: Two times a day (BID) | ORAL | 3 refills | Status: DC

## 2023-07-08 NOTE — Telephone Encounter (Signed)
Pt c/o medication issue:  1. Name of Medication: sacubitril-valsartan (ENTRESTO) 24-26 MG   ranolazine (RANEXA) 500 MG 12 hr tablet   2. How are you currently taking this medication (dosage and times per day)? As written   3. Are you having a reaction (difficulty breathing--STAT)? No  4. What is your medication issue? Patient's daughter is calling because she was wanting a 90 day supply for both medications to be sent to the [place] instead of the 30 day supply.

## 2023-07-11 ENCOUNTER — Telehealth: Payer: Self-pay | Admitting: Cardiology

## 2023-07-11 NOTE — Telephone Encounter (Signed)
Spoke with pt daughter, the office note from 02/03/21 faxed to Dr Ronaldo Miyamoto, that is when the ranexa was started. The most recent echocardiogram results fax for the reason for entresto. They were faxed to [phone].

## 2023-07-11 NOTE — Telephone Encounter (Signed)
*  STAT* If patient is at the pharmacy, call can be transferred to refill team.   1. Which medications need to be refilled? (please list name of each medication and dose if known) sacubitril-valsartan (ENTRESTO) 24-26 MG  ranolazine (RANEXA) 500 MG 12 hr tablet  2. Which pharmacy/location (including street and city if local pharmacy) is medication to be sent to? [facility] - [place], [place] - [address]   3. Do they need a 30 day or 90 day supply?   Patient's daughter states [facility] needs the prescription + a note stating why the patient needs this medication.

## 2023-07-11 NOTE — Telephone Encounter (Signed)
Spoke with Drinda Butts aware [facility] sent [facility] statement indicating the patient was not authorize for community care and the prescriptions needed to be sent elsewhere. She will contact his PCP to address this.

## 2023-07-16 DIAGNOSIS — D519 Vitamin B12 deficiency anemia, unspecified: Secondary | ICD-10-CM | POA: Diagnosis not present

## 2023-07-24 ENCOUNTER — Telehealth: Payer: Self-pay | Admitting: Cardiology

## 2023-07-24 NOTE — Telephone Encounter (Signed)
Pt c/o medication issue:  1. Name of Medication:   ranolazine (RANEXA) 500 MG 12 hr tablet    sacubitril-valsartan (ENTRESTO) 24-26 MG    2. How are you currently taking this medication (dosage and times per day)?   3. Are you having a reaction (difficulty breathing--STAT)? No  4. What is your medication issue? Pt's daughter calling to f/u on fax that was to be sent to the [place] on for above medications. (See 9/27 note). Daughter would like a callback regarding this matter due to [place] stating that they have yet to receive fax. Please advise

## 2023-07-25 ENCOUNTER — Other Ambulatory Visit: Payer: Self-pay

## 2023-07-25 MED ORDER — ENTRESTO 24-26 MG PO TABS: 1.0000 | ORAL_TABLET | Freq: Two times a day (BID) | ORAL | 3 refills | Status: AC

## 2023-07-25 MED ORDER — RANOLAZINE ER 500 MG PO TB12: 500.0000 mg | ORAL_TABLET | Freq: Two times a day (BID) | ORAL | 3 refills | Status: AC

## 2023-07-25 MED ORDER — ENTRESTO 24-26 MG PO TABS: 1.0000 | ORAL_TABLET | Freq: Two times a day (BID) | ORAL | 3 refills | Status: DC

## 2023-07-25 NOTE — Telephone Encounter (Signed)
Daughter called again stating the prescriptions had not been received by the [place], [place] ATTN:  Dr. Ronaldo Miyamoto.  Daughter stated patient is almost out of the medications and she wants a 90 day supply and noted the reason for the medication also needs to be stated in the fax.  Daughter wants a call back to confirm prescription has been faxed.

## 2023-07-25 NOTE — Telephone Encounter (Signed)
Called to get number to fax prescriptions to [place]. No answer.. left vm to call back.

## 2023-07-25 NOTE — Telephone Encounter (Signed)
Spoke with pt's daughter who states Dr Rogelia Mire office still has not received Rx for [employer] and Ran[employer]a.  Will forward to Dr Vanetta Shawl nurse to reprint and fax.  Please see previous messages.

## 2023-07-28 ENCOUNTER — Other Ambulatory Visit: Payer: Self-pay

## 2023-07-28 NOTE — Telephone Encounter (Signed)
Left message for [facility] pharmacy to call to get further information on the reason these medications are not being accepted by the [place]. After being sent several times.

## 2023-07-29 NOTE — Telephone Encounter (Signed)
[facility]. She gave information needed to send RX to [place]. Always send fax cover sheet with meds, pt name, DOB, Attn Dr. Ronaldo Miyamoto- [phone] last office note. This has to go to pts PCP at [facility] to be written for pt to be able to get at [facility].

## 2023-07-31 DIAGNOSIS — Z0000 Encounter for general adult medical examination without abnormal findings: Secondary | ICD-10-CM | POA: Diagnosis not present

## 2023-07-31 DIAGNOSIS — I10 Essential (primary) hypertension: Secondary | ICD-10-CM | POA: Diagnosis not present

## 2023-07-31 DIAGNOSIS — E1129 Type 2 diabetes mellitus with other diabetic kidney complication: Secondary | ICD-10-CM | POA: Diagnosis not present

## 2023-07-31 DIAGNOSIS — Z23 Encounter for immunization: Secondary | ICD-10-CM | POA: Diagnosis not present

## 2023-07-31 DIAGNOSIS — J449 Chronic obstructive pulmonary disease, unspecified: Secondary | ICD-10-CM | POA: Diagnosis not present

## 2023-07-31 DIAGNOSIS — Z6822 Body mass index (BMI) 22.0-22.9, adult: Secondary | ICD-10-CM | POA: Diagnosis not present

## 2023-08-06 DIAGNOSIS — R062 Wheezing: Secondary | ICD-10-CM | POA: Diagnosis not present

## 2023-08-06 DIAGNOSIS — J449 Chronic obstructive pulmonary disease, unspecified: Secondary | ICD-10-CM | POA: Diagnosis not present

## 2023-08-19 ENCOUNTER — Ambulatory Visit: Payer: PPO | Admitting: Thoracic Surgery (Cardiothoracic Vascular Surgery)

## 2023-08-19 ENCOUNTER — Encounter: Payer: Self-pay | Admitting: Thoracic Surgery (Cardiothoracic Vascular Surgery)

## 2023-08-19 ENCOUNTER — Ambulatory Visit
Admission: RE | Admit: 2023-08-19 | Discharge: 2023-08-19 | Disposition: A | Discharge status: Home / Self Care | Payer: PPO | Source: Ambulatory Visit | Attending: Thoracic Surgery (Cardiothoracic Vascular Surgery) | Admitting: Thoracic Surgery (Cardiothoracic Vascular Surgery)

## 2023-08-19 VITALS — BP 124/75 | HR 48 | Resp 20 | Ht 66.0 in | Wt 144.4 lb

## 2023-08-19 DIAGNOSIS — I7121 Aneurysm of the ascending aorta, without rupture: Secondary | ICD-10-CM

## 2023-08-19 DIAGNOSIS — I7123 Aneurysm of the descending thoracic aorta, without rupture: Secondary | ICD-10-CM | POA: Diagnosis not present

## 2023-08-19 MED ORDER — IOPAMIDOL (ISOVUE-370) INJECTION 76%
500.0000 mL | Freq: Once | INTRAVENOUS | Status: AC | PRN
  Administered 2023-08-19: 75 mL via INTRAVENOUS

## 2023-08-19 NOTE — Progress Notes (Signed)
[address]       [name] [address]             [phone]     HPI: [name] returns for follow-up of his ascending and descending thoracic aortic aneurysms.  Drew [name] is an 82 year old man with a history of hypertension, abdominal aortic aneurysm, stent graft, thoracic aortic atherosclerosis, ascending and descending thoracic aneurysms, remote tobacco use, COPD, and a lingular resection for lung cancer in 2014.  His abdominal aortic aneurysm was done in [place] many years ago.  First noted to have an ascending aneurysm in 2014.  It was 5 centimeters at that time.  I last saw him in April 2024.  His ascending aneurysm was about 5.2 to 5.3 cm.  There was an area of his descending aneurysm they measured at around 5.5 cm although I think that was a slight overestimate.  He feels well.  He denies chest pain, pressure, tightness, shortness of breath, claudication.  Not particularly active.  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   Coronary artery disease 10/15/2016   Diabetes type 2, uncontrolled 04/22/2018   Dyspnea 03/01/2015   Followed in Pulmonary clinic/ [facility]/ [facility] - Labs 12/29/14 nl cbc, tsh, bmet  -  03/01/2015  Walked RA x 3 laps @ 185 ft each stopped due to  End of study, no sob or desat - pfts 03/01/2015  Restrictive only with ERV 36% >> rec try off spiriva      Esophageal reflux    History of lung cancer 09/02/2017   History of lung cancer 09/02/2017   Hydrocele    Hypercholesteremia 10/15/2016   Hyperlipidemia    Hypertension    Insomnia 01/11/2021   Ischemic heart disease    Low back pain 01/11/2021   Neck pain 01/11/2021   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     aspirin 81 MG tablet Take 81 mg by mouth daily.     budesonide-formoterol (SYMBICORT) 80-4.5 MCG/ACT inhaler Inhale 2 puffs into the lungs 2 (two) times daily.     clopidogrel (PLAVIX) 75 MG tablet Take 75 mg by mouth daily.     DULoxetine (CYMBALTA) 60 MG capsule Take 60 mg by mouth daily.     famotidine (PEPCID) 10 MG tablet Take 20 mg by mouth at bedtime.  3   finasteride (PROPECIA) 1 MG tablet Take 5 mg by mouth daily.     Fluticasone-Salmeterol (  WIXELA INHUB IN) Inhale 1 puff into the lungs daily.     loratadine (CLARITIN) 10 MG tablet Take 10 mg by mouth daily as needed for allergies.     metoprolol (TOPROL-XL) 200 MG 24 hr tablet Take 100 mg by mouth daily.     nitroGLYCERIN (NITROSTAT) 0.4 MG SL tablet Place 0.4 mg under the tongue every 5 (five) minutes as needed for chest pain.     Omega-3 Fatty Acids (FISH OIL) 1000 MG CAPS Take 1 tablet by mouth 2 (two) times daily.     pantoprazole (PROTONIX) 40 MG tablet Take 40 mg by mouth daily.     ranolazine (RANEXA) 500 MG 12 hr tablet Take 1 tablet (500 mg total) by mouth 2 (two) times daily. 180 tablet 3   sacubitril-valsartan (ENTRESTO) 24-26 MG Take 1 tablet by mouth 2 (two) times daily. 180  tablet 3   Simethicone (GAS-X PO) Take 1 tablet by mouth at bedtime.     traZODone (DESYREL) 100 MG tablet Take 100 mg by mouth at bedtime.     rosuvastatin (CRESTOR) 20 MG tablet Take 1 tablet (20 mg total) by mouth daily. 90 tablet 3   No current facility-administered medications for this visit.    Physical Exam BP 124/75 (BP Location: Right Arm, Patient Position: Sitting, Cuff Size: Normal)   Pulse (!) 48   Resp 20   Ht 5\' 6"  (1.676 m)   Wt 144 lb 6.4 oz (65.5 kg)   SpO2 95%   BMI 23.7 kg/m  82 year old man in no acute distress Alert and oriented x 3 with no focal deficits Lungs diminished breath sounds bilaterally but clear with no wheezing Cardiac regular rate and rhythm (60 bpm), no murmur No carotid bruits No peripheral edema  Diagnostic Tests: CT angiogram done today has not been officially read. Comparison to his films from April shows no significant change in his ascending or descending aneurysms or his aortic and coronary atherosclerosis.  Impression: Drew [name] is an 82 year old man with a history of hypertension, abdominal aortic aneurysm, stent graft, thoracic aortic atherosclerosis, ascending and descending thoracic aneurysms, remote tobacco use, COPD, and a lingular resection for lung cancer in 2014.  His abdominal aortic aneurysm was done in [place] many years ago.  Ascending and descending thoracic aortic aneurysms and thoracic aortic atherosclerosis-aneurysm stable.  There is one area of the descending aneurysm that is close to 5.5 cm.  Might potentially be treatable with a stent graft although it does not certainly have to be done immediately.  Will refer to [facility] for evaluation.  Needs continued semiannual follow-up for his aneurysms.  Hypertension-blood pressure well-controlled at present.  Lung cancer-status post lingular resection by Dr. Edwyna Shell in 2014.  Abdominal aortic aneurysm-stent graft in remote past  Plan: Refer to [facility] for  evaluation of descending thoracic aneurysm Return in 6 months with CT angiogram of chest, abdomen, and pelvis.  I spent over 20 minutes in review of records, images, and in consultation with Mr. Xiong today. Loreli Slot, MD [facility] [phone]

## 2023-09-04 NOTE — Progress Notes (Signed)
Patient ID: Drew [name], male   DOB: 09/02/1941, 82 y.o.   MRN: [id number]  Reason for Consult: New Patient (Initial Visit)   Referred by Loreli Slot, *  Subjective:     HPI  Drew [name] is a 82 y.o. male with a history of HTN, and AAA that was repaired by EVAR over 20 years ago, was performed in [place].  He has been followed by the cardiothoracic surgery service for an ascending aortic aneurysm that has been stable at about 5.3 cm.  He also has a 5.5 cm descending thoracic aortic aneurysm.  Overall he feels well.  He denies chest pain, back pain, shortness of breath or claudication.  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   Coronary artery disease 10/15/2016   Diabetes type 2, uncontrolled 04/22/2018   Dyspnea 03/01/2015   Followed in Pulmonary clinic/ [facility]/ [facility] - Labs 12/29/14 nl cbc, tsh, bmet  -  03/01/2015  Walked RA x 3 laps @ 185 ft each stopped due to  End of study, no sob or desat - pfts 03/01/2015  Restrictive only with ERV 36% >> rec try off spiriva     Esophageal reflux    History of lung cancer 09/02/2017   History of lung cancer 09/02/2017   Hydrocele    Hypercholesteremia 10/15/2016   Hyperlipidemia    Hypertension    Insomnia 01/11/2021   Ischemic heart disease    Low back pain 01/11/2021   Neck pain 01/11/2021   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   Pain in right hand 03/23/2020   Peripheral vascular disease (HCC)    Renal artery stenosis (HCC) 10/15/2016   Sleep disorder 04/22/2018   Thoracic aortic aneurysm without mention of rupture 2011   Type 2 diabetes mellitus (HCC) 01/11/2021   Family History  Problem Relation Age of Onset   Heart disease Mother    Brain cancer Mother    Heart disease Father    Lung cancer Father        smoked   CAD Father    Prostate cancer Father    Thyroid disease Sister    Past Surgical History:  Procedure Laterality Date   ABDOMINAL AORTIC ANEURYSM REPAIR W/ ENDOLUMINAL GRAFT  1999   CORONARY ARTERY BYPASS GRAFT     CORONARY STENT PLACEMENT  2003   left lower lobectomy  2012    Short Social History:  Social History   Tobacco Use   Smoking status: Former    Current packs/day: 0.00    Average packs/day: 2.0 packs/day for 36.0 years (72.0 ttl pk-yrs)    Types: Cigarettes    Start date: 10/14/1952    Quit date: 10/14/1988    Years since quitting: 34.9   Smokeless tobacco: Former    Quit date: 04/09/1988  Substance Use Topics   Alcohol use: No  Alcohol/week: 0.0 standard drinks of alcohol    Allergies  Allergen Reactions   Ethanol Hives    ETHANOL, AFTER SHAVE....FOR LONG PERIODS   Alcohol Hives   Azithromycin    Lisinopril    Tramadol Hcl    Cephalexin Itching and Rash   Neomycin Rash    MAKES SORE WORSE    Current Outpatient Medications  Medication Sig Dispense Refill   Acetaminophen (ACETAMINOPHEN EXTRA STRENGTH) 500 MG capsule Take 1 capsule by mouth at bedtime.     aspirin 81 MG tablet Take 81 mg by mouth daily.     budesonide-formoterol (SYMBICORT) 80-4.5 MCG/ACT inhaler Inhale 2 puffs into the lungs 2 (two) times daily.     clopidogrel (PLAVIX) 75 MG tablet Take 75 mg by mouth daily.     DULoxetine (CYMBALTA) 60 MG capsule Take 60 mg by mouth daily.     famotidine (PEPCID) 10 MG tablet Take 20 mg by mouth at bedtime.  3   finasteride (PROPECIA)  1 MG tablet Take 5 mg by mouth daily.     Fluticasone-Salmeterol (WIXELA INHUB IN) Inhale 1 puff into the lungs daily.     loratadine (CLARITIN) 10 MG tablet Take 10 mg by mouth daily as needed for allergies.     metoprolol (TOPROL-XL) 200 MG 24 hr tablet Take 100 mg by mouth daily.     nitroGLYCERIN (NITROSTAT) 0.4 MG SL tablet Place 0.4 mg under the tongue every 5 (five) minutes as needed for chest pain.     Omega-3 Fatty Acids (FISH OIL) 1000 MG CAPS Take 1 tablet by mouth 2 (two) times daily.     pantoprazole (PROTONIX) 40 MG tablet Take 40 mg by mouth daily.     ranolazine (RANEXA) 500 MG 12 hr tablet Take 1 tablet (500 mg total) by mouth 2 (two) times daily. 180 tablet 3   sacubitril-valsartan (ENTRESTO) 24-26 MG Take 1 tablet by mouth 2 (two) times daily. 180 tablet 3   Simethicone (GAS-X PO) Take 1 tablet by mouth at bedtime.     traZODone (DESYREL) 100 MG tablet Take 100 mg by mouth at bedtime.     rosuvastatin (CRESTOR) 20 MG tablet Take 1 tablet (20 mg total) by mouth daily. 90 tablet 3   No current facility-administered medications for this visit.    REVIEW OF SYSTEMS  Negative other than noted in HPI     Objective:  Objective   Vitals:   09/05/23 1509  BP: (!) 142/87  Pulse: (!) 58  Resp: 20  Temp: 97.7 F (36.5 C)  SpO2: 96%  Weight: 146 lb (66.2 kg)  Height: 5\' 6"  (1.676 m)   Body mass index is 23.57 kg/m.  Physical Exam General: no acute distress Cardiac: hemodynamically stable, nontachycardic Pulm: normal work of breathing GI: non-tender, no pulsatile mass Neuro: alert, no focal deficit Extremities: no edema, cyanosis or wounds   Data: CTA chest from 08/19/2023 personally reviewed Approximately 5.5 cm mid descending thoracic aortic aneurysm. Patent left renal stent  Reviewed recent CT surgery follow up note from Dr. Dorris Fetch  Most recent Cr. 0.77      Assessment/Plan:     Drew [name] is a 82 y.o. male whose had a EVAR done over 20  years ago and [place] who is presenting for evaluation of a 5.5 mid descending thoracic aortic aneurysm.  This has grown approximately 2 mm in the last 6 months.  We discussed size threshold of 6 cm for his descending thoracic aortic aneurysm.  I explained that we should definitely hold off on any descending thoracic repair until this threshold given his increased risk of spinal cord ischemia with his previous abdominal aortic stent graft.  We also discussed that he is not head any surveillance of many years of his abdominal aortic aneurysm.  Will plan to follow-up in 6 months as this is his follow-up for his ascending aortic aneurysm with CT surgery Prior to both of these appointments he should receive a CTA chest abdomen pelvis which would allow [place] to visualize the entire aorta and evaluate his thoracic aneurysms as well as his repaired abdominal aneurysm.    Recommendations to optimize cardiovascular risk: Abstinence from all tobacco products. Blood glucose control with goal A1c < 7%. Blood pressure control with goal blood pressure < 140/90 mmHg. Lipid reduction therapy with goal LDL-C <100 mg/dL  Aspirin 81mg  PO QD.  Atorvastatin 40-80mg  PO QD (or other "high intensity" statin therapy).     Daria Pastures MD [facility]

## 2023-09-05 ENCOUNTER — Encounter: Payer: Self-pay | Admitting: Vascular Surgery

## 2023-09-05 ENCOUNTER — Ambulatory Visit: Payer: PPO | Admitting: Vascular Surgery

## 2023-09-05 VITALS — BP 142/87 | HR 58 | Temp 97.7°F | Resp 20 | Ht 66.0 in | Wt 146.0 lb

## 2023-09-05 DIAGNOSIS — I7123 Aneurysm of the descending thoracic aorta, without rupture: Secondary | ICD-10-CM | POA: Diagnosis not present

## 2023-09-05 DIAGNOSIS — I7143 Infrarenal abdominal aortic aneurysm, without rupture: Secondary | ICD-10-CM | POA: Diagnosis not present

## 2023-09-05 DIAGNOSIS — I7121 Aneurysm of the ascending aorta, without rupture: Secondary | ICD-10-CM

## 2023-09-08 ENCOUNTER — Telehealth: Payer: Self-pay | Admitting: *Deleted

## 2023-09-08 NOTE — Telephone Encounter (Signed)
Larita Fife with [facility] contacted the office stating patient was seen by Dr. Hetty Blend who will be ordering a CTA C/A/P in 6 months for AAA. Patient will also be due for a CTA chest with Dr. Dorris Fetch in 6 months to evaluate for TAA. Discussed with Larita Fife that [facility] can order the scan as there is no need for CTA chest to be done at a separate occasion. Our office knows to schedule f/u with Dr. Dorris Fetch once CTA C/A/P is performed by [facility] sometime in May 2025.

## 2023-09-09 DIAGNOSIS — H52223 Regular astigmatism, bilateral: Secondary | ICD-10-CM | POA: Diagnosis not present

## 2023-09-09 DIAGNOSIS — H524 Presbyopia: Secondary | ICD-10-CM | POA: Diagnosis not present

## 2023-09-09 DIAGNOSIS — H5203 Hypermetropia, bilateral: Secondary | ICD-10-CM | POA: Diagnosis not present

## 2023-09-09 DIAGNOSIS — H25813 Combined forms of age-related cataract, bilateral: Secondary | ICD-10-CM | POA: Diagnosis not present

## 2023-09-09 DIAGNOSIS — H353111 Nonexudative age-related macular degeneration, right eye, early dry stage: Secondary | ICD-10-CM | POA: Diagnosis not present

## 2023-09-09 DIAGNOSIS — H3530 Unspecified macular degeneration: Secondary | ICD-10-CM | POA: Diagnosis not present

## 2023-09-17 DIAGNOSIS — D519 Vitamin B12 deficiency anemia, unspecified: Secondary | ICD-10-CM | POA: Diagnosis not present

## 2023-11-04 ENCOUNTER — Ambulatory Visit: Payer: PPO | Attending: Cardiology | Admitting: Cardiology

## 2023-11-04 ENCOUNTER — Encounter: Payer: Self-pay | Admitting: Cardiology

## 2023-11-04 VITALS — BP 96/60 | HR 62 | Ht 64.0 in | Wt 146.0 lb

## 2023-11-04 DIAGNOSIS — E7800 Pure hypercholesterolemia, unspecified: Secondary | ICD-10-CM | POA: Diagnosis not present

## 2023-11-04 DIAGNOSIS — I25118 Atherosclerotic heart disease of native coronary artery with other forms of angina pectoris: Secondary | ICD-10-CM

## 2023-11-04 DIAGNOSIS — I7121 Aneurysm of the ascending aorta, without rupture: Secondary | ICD-10-CM

## 2023-11-04 DIAGNOSIS — I10 Essential (primary) hypertension: Secondary | ICD-10-CM

## 2023-11-04 DIAGNOSIS — R0609 Other forms of dyspnea: Secondary | ICD-10-CM

## 2023-11-04 NOTE — Patient Instructions (Addendum)
 Medication Instructions:  Your physician recommends that you continue on your current medications as directed. Please refer to the Current Medication list given to you today.  *If you need a refill on your cardiac medications before your next appointment, please call your pharmacy*   Lab Work: None Ordered If you have labs (blood work) drawn today and your tests are completely normal, you will receive your results only by: MyChart Message (if you have MyChart) OR A paper copy in the mail If you have any lab test that is abnormal or we need to change your treatment, we will call you to review the results.   Testing/Procedures: Your physician has requested that you have an echocardiogram. Echocardiography is a painless test that uses sound waves to create images of your heart. It provides your doctor with information about the size and shape of your heart and how well your heart's chambers and valves are working. This procedure takes approximately one hour. There are no restrictions for this procedure. Please do NOT wear cologne, perfume, aftershave, or lotions (deodorant is allowed). Please arrive 15 minutes prior to your appointment time.  Please note: We ask at that you not bring children with you during ultrasound (echo/ vascular) testing. Due to room size and safety concerns, children are not allowed in the ultrasound rooms during exams. Our front office staff cannot provide observation of children in our lobby area while testing is being conducted. An adult accompanying a patient to their appointment will only be allowed in the ultrasound room at the discretion of the ultrasound technician under special circumstances. We apologize for any inconvenience.    Follow-Up: At [facility], you and your health needs are our priority.  As part of our continuing mission to provide you with exceptional heart care, we have created designated Provider Care Teams.  These Care Teams include your  primary Cardiologist (physician) and Advanced Practice Providers (APPs -  Physician Assistants and Nurse Practitioners) who all work together to provide you with the care you need, when you need it.  We recommend signing up for the patient portal called "MyChart".  Sign up information is provided on this After Visit Summary.  MyChart is used to connect with patients for Virtual Visits (Telemedicine).  Patients are able to view lab/test results, encounter notes, upcoming appointments, etc.  Non-urgent messages can be sent to your provider as well.   To learn more about what you can do with MyChart, go to [email or web address].    Your next appointment:   12 month(s)  The format for your next appointment:   In Person  Provider:   Gypsy Balsam, MD    Other Instructions NA

## 2023-11-04 NOTE — Progress Notes (Unsigned)
Cardiology Office Note:    Date:  11/04/2023   ID:  Drew [name], DOB Jul 04, 1941, MRN [id number]  PCP:  Annamaria Helling, DO  Cardiologist:  Gypsy Balsam, MD    Referring MD: Noni Saupe, MD   Chief Complaint  Patient presents with   Follow-up    History of Present Illness:    Drew [name] is a 83 y.o. male past medical history significant for coronary disease status post PTCA and stenting of the right coronary artery done many years ago, mildly diminished ejection fraction with mild inferior wall hypokinesis, last evaluation of CAD was done in form of stress test December 2021 which showed no evidence of ischemia.  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  Comes today to months for follow-up overall doing fine.  He complained of having cold weather so he spent majority time sitting at home.  Denies have any chest pain tightness squeezing pressure burning chest.  Overall seems to be doing well  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   Coronary artery disease 10/15/2016   Diabetes type 2, uncontrolled 04/22/2018   Dyspnea 03/01/2015   Followed in Pulmonary clinic/ [facility]/ [facility] - Labs 12/29/14 nl cbc, tsh, bmet  -  03/01/2015  Walked RA x 3 laps @ 185 ft each stopped due to  End  of study, no sob or desat - pfts 03/01/2015  Restrictive only with ERV 36% >> rec try off spiriva     Esophageal reflux    History of lung cancer 09/02/2017   History of lung cancer 09/02/2017   Hydrocele    Hypercholesteremia 10/15/2016   Hyperlipidemia    Hypertension    Insomnia 01/11/2021   Ischemic heart disease    Low back pain 01/11/2021   Neck pain 01/11/2021   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   Pain in right hand 03/23/2020   Peripheral vascular disease (HCC)    Renal artery stenosis (HCC) 10/15/2016   Sleep disorder 04/22/2018   Thoracic aortic aneurysm without mention of rupture 2011   Type 2 diabetes mellitus (HCC) 01/11/2021    Past Surgical History:  Procedure Laterality Date   ABDOMINAL AORTIC ANEURYSM REPAIR W/ ENDOLUMINAL GRAFT  1999   CORONARY ARTERY BYPASS GRAFT     CORONARY STENT PLACEMENT  2003   left lower lobectomy  2012    Current Medications: Current Meds  Medication Sig   Acetaminophen (ACETAMINOPHEN EXTRA STRENGTH) 500 MG capsule Take 1 capsule by mouth at bedtime.   aspirin 81 MG tablet Take 81 mg by mouth daily.   budesonide-formoterol (SYMBICORT) 80-4.5 MCG/ACT inhaler Inhale 2 puffs into the lungs 2 (two) times daily.   clopidogrel (PLAVIX) 75 MG tablet Take 75 mg by mouth daily.   DULoxetine (CYMBALTA) 60  MG capsule Take 60 mg by mouth daily.   famotidine (PEPCID) 10 MG tablet Take 20 mg by mouth at bedtime.   finasteride (PROPECIA) 1 MG tablet Take 5 mg by mouth daily.   Fluticasone-Salmeterol (WIXELA INHUB IN) Inhale 1 puff into the lungs daily.   loratadine (CLARITIN) 10 MG tablet Take 10 mg by mouth daily as needed for allergies.   metoprolol (TOPROL-XL) 200 MG 24 hr tablet Take 100 mg by mouth daily.   nitroGLYCERIN (NITROSTAT) 0.4 MG SL tablet Place 0.4 mg under the tongue every 5 (five) minutes as needed for chest pain.   Omega-3 Fatty Acids (FISH OIL) 1000 MG CAPS Take 1 tablet by mouth 2 (two) times daily.    pantoprazole (PROTONIX) 40 MG tablet Take 40 mg by mouth daily.   ranolazine (RANEXA) 500 MG 12 hr tablet Take 1 tablet (500 mg total) by mouth 2 (two) times daily.   sacubitril-valsartan (ENTRESTO) 24-26 MG Take 1 tablet by mouth 2 (two) times daily.   Simethicone (GAS-X PO) Take 1 tablet by mouth at bedtime.   tamsulosin (FLOMAX) 0.4 MG CAPS capsule Take 0.4 mg by mouth daily after breakfast.   traZODone (DESYREL) 100 MG tablet Take 100 mg by mouth at bedtime.     Allergies:   Ethanol, Alcohol, Azithromycin, Lisinopril, Tramadol hcl, Cephalexin, and Neomycin   Social History   Socioeconomic History   Marital status: Married    Spouse name: Not on file   Number of children: Not on file   Years of education: Not on file   Highest education level: Not on file  Occupational History   Not on file  Tobacco Use   Smoking status: Former    Current packs/day: 0.00    Average packs/day: 2.0 packs/day for 36.0 years (72.0 ttl pk-yrs)    Types: Cigarettes    Start date: 10/14/1952    Quit date: 10/14/1988    Years since quitting: 35.0   Smokeless tobacco: Former    Quit date: 04/09/1988  Vaping Use   Vaping status: Never Used  Substance and Sexual Activity   Alcohol use: No    Alcohol/week: 0.0 standard drinks of alcohol   Drug use: Not Currently   Sexual activity: Not on file  Other Topics Concern   Not on file  Social History Narrative   Not on file   Social Drivers of Health   Financial Resource Strain: Not on file  Food Insecurity: Not on file  Transportation Needs: Not on file  Physical Activity: Not on file  Stress: Not on file  Social Connections: Not on file     Family History: The patient's family history includes Brain cancer in his mother; CAD in his father; Heart disease in his father and mother; Lung cancer in his father; Prostate cancer in his father; Thyroid disease in his sister. ROS:   Please see the history of present illness.    All 14 point review of  systems negative except as described per history of present illness  EKGs/Labs/Other Studies Reviewed:         Recent Labs: 02/10/2023: BUN 13; Creatinine, Ser 0.77; Potassium 4.9; Sodium 138  Recent Lipid Panel    Component Value Date/Time   CHOL 137 02/10/2023 1449   TRIG 127 02/10/2023 1449   HDL 38 (L) 02/10/2023 1449   CHOLHDL 3.6 02/10/2023 1449   LDLCALC 76 02/10/2023 1449    Physical Exam:    VS:  BP 96/60 (BP Location: Right Arm, Patient  Position: Sitting)   Pulse 62   Ht 5\' 4"  (1.626 m)   Wt 146 lb (66.2 kg)   SpO2 96%   BMI 25.06 kg/m     Wt Readings from Last 3 Encounters:  11/04/23 146 lb (66.2 kg)  09/05/23 146 lb (66.2 kg)  08/19/23 144 lb 6.4 oz (65.5 kg)     GEN:  Well nourished, well developed in no acute distress HEENT: Normal NECK: No JVD; No carotid bruits LYMPHATICS: No lymphadenopathy CARDIAC: RRR, no murmurs, no rubs, no gallops RESPIRATORY:  Clear to auscultation without rales, wheezing or rhonchi  ABDOMEN: Soft, non-tender, non-distended MUSCULOSKELETAL:  No edema; No deformity  SKIN: Warm and dry LOWER EXTREMITIES: no swelling NEUROLOGIC:  Alert and oriented x 3 PSYCHIATRIC:  Normal affect   ASSESSMENT:    1. Benign essential hypertension   2. Aneurysm of ascending aorta without rupture (HCC)   3. Coronary artery disease of native artery of native heart with stable angina pectoris (HCC)   4. Hypercholesteremia    PLAN:    In order of problems listed above:  Coronary disease stable from that point to be an appropriate guideline directed medical therapy which I will continue. Aneurysm of the ascending and descending thoracic aorta followed by cardiothoracic surgeon and vascular surgeon. Essential hypertension blood pressure well-controlled. Dyslipidemia I did review K PN which show me LDL of 76 HDL 31 we will continue present management.   Medication Adjustments/Labs and Tests Ordered: Current medicines are reviewed at length  with the patient today.  Concerns regarding medicines are outlined above.  Orders Placed This Encounter  Procedures   EKG 12-Lead   Medication changes: No orders of the defined types were placed in this encounter.   Signed, Georgeanna Lea, MD, [facility] 11/04/2023 3:52 PM    [facility]

## 2023-11-05 ENCOUNTER — Ambulatory Visit: Payer: PPO

## 2023-11-21 ENCOUNTER — Ambulatory Visit: Payer: PPO | Attending: Cardiology

## 2023-11-21 DIAGNOSIS — R0609 Other forms of dyspnea: Secondary | ICD-10-CM

## 2023-11-21 LAB — ECHOCARDIOGRAM COMPLETE
Area-P 1/2: 2.15 cm2
Est EF: 40
MV M vel: 4.81 m/s
MV Peak grad: 92.5 mm[Hg]
P 1/2 time: 524 ms
Radius: 0.5 cm
S' Lateral: 4.1 cm

## 2023-11-26 ENCOUNTER — Telehealth: Payer: Self-pay

## 2023-11-26 NOTE — Telephone Encounter (Signed)
-----   Message from Gypsy Balsam sent at 11/25/2023  9:41 AM EST ----- Echocardiogram shows similar ejection fraction, dilated aortic root 54 mm.  Continue monitoring

## 2023-11-26 NOTE — Telephone Encounter (Signed)
Spoke to [place], notified of results

## 2023-12-23 ENCOUNTER — Encounter: Payer: Self-pay | Admitting: Vascular Surgery

## 2024-01-21 ENCOUNTER — Other Ambulatory Visit: Payer: Self-pay

## 2024-01-21 DIAGNOSIS — I7123 Aneurysm of the descending thoracic aorta, without rupture: Secondary | ICD-10-CM

## 2024-02-05 ENCOUNTER — Ambulatory Visit (HOSPITAL_BASED_OUTPATIENT_CLINIC_OR_DEPARTMENT_OTHER)
Admission: RE | Admit: 2024-02-05 | Discharge: 2024-02-05 | Disposition: A | Discharge status: Home / Self Care | Source: Ambulatory Visit | Attending: Vascular Surgery | Admitting: Radiology

## 2024-02-05 DIAGNOSIS — I7123 Aneurysm of the descending thoracic aorta, without rupture: Secondary | ICD-10-CM

## 2024-02-05 LAB — I-STAT CREATININE (MANUAL ENTRY): Creatinine, Ser: 0.9 (ref 0.50–1.10)

## 2024-02-05 MED ORDER — IOHEXOL 350 MG/ML SOLN
100.0000 mL | Freq: Once | INTRAVENOUS | Status: AC | PRN
  Administered 2024-02-05: 100 mL via INTRAVENOUS

## 2024-02-24 ENCOUNTER — Ambulatory Visit: Admitting: Thoracic Surgery (Cardiothoracic Vascular Surgery)

## 2024-03-09 ENCOUNTER — Ambulatory Visit: Admitting: Thoracic Surgery (Cardiothoracic Vascular Surgery)

## 2024-03-11 NOTE — Progress Notes (Unsigned)
 Patient ID: Drew [name], male   DOB: [birth year], 83 y.o.   MRN: [id number]  Reason for Consult: No chief complaint on file.   Referred by Madelyne Schiff, MD  Subjective:  HPI Drew [name] is a 83 y.o. male with a history of AAA that was repaired with EVAR over 20 years ago in [place].  He presents for follow-up today for reevaluation of his thoracic aortic aneurysm.  He denies any new or unusual chest pain, back pain, shortness of breath or claudication.  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   Coronary artery disease 10/15/2016   Diabetes type 2, uncontrolled 04/22/2018   Dyspnea 03/01/2015   Followed in Pulmonary clinic/ [facility]/ [facility] - Labs 12/29/14 nl cbc, tsh, bmet  -  03/01/2015  Walked RA x 3 laps @ 185 ft each stopped due to  End of study, no sob or desat - pfts 03/01/2015  Restrictive only with ERV 36% >> rec try off spiriva     Esophageal reflux    History of lung cancer 09/02/2017   History of lung cancer 09/02/2017   Hydrocele    Hypercholesteremia 10/15/2016   Hyperlipidemia    Hypertension    Insomnia 01/11/2021   Ischemic heart disease    Low back pain 01/11/2021   Neck pain 01/11/2021   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   Pain in right hand 03/23/2020   Peripheral vascular disease (HCC)    Renal artery stenosis (HCC) 10/15/2016   Sleep  disorder 04/22/2018   Thoracic aortic aneurysm without mention of rupture 2011   Type 2 diabetes mellitus (HCC) 01/11/2021   Family History  Problem Relation Age of Onset   Heart disease Mother    Brain cancer Mother    Heart disease Father    Lung cancer Father        smoked   CAD Father    Prostate cancer Father    Thyroid  disease Sister    Past Surgical History:  Procedure Laterality Date   ABDOMINAL AORTIC ANEURYSM REPAIR W/ ENDOLUMINAL GRAFT  1999   CORONARY ARTERY BYPASS GRAFT     CORONARY STENT PLACEMENT  2003   left lower lobectomy  2012    Short Social History:  Social History   Tobacco Use   Smoking status: Former    Current packs/day: 0.00    Average packs/day: 2.0 packs/day for 36.0 years (72.0 ttl pk-yrs)    Types: Cigarettes    Start date: 10/14/1952    Quit date: 10/14/1988    Years since quitting: 35.4   Smokeless tobacco: Former    Quit date: 04/09/1988  Substance Use Topics   Alcohol use: No    Alcohol/week: 0.0 standard drinks of alcohol    Allergies  Allergen Reactions   Ethanol Hives    ETHANOL, AFTER SHAVE....FOR LONG PERIODS  Alcohol Hives   Azithromycin    Lisinopril    Tramadol Hcl    Cephalexin Itching and Rash   Neomycin Rash    MAKES SORE WORSE    Current Outpatient Medications  Medication Sig Dispense Refill   Acetaminophen (ACETAMINOPHEN EXTRA STRENGTH) 500 MG capsule Take 1 capsule by mouth at bedtime.     aspirin 81 MG tablet Take 81 mg by mouth daily.     budesonide-formoterol (SYMBICORT) 80-4.5 MCG/ACT inhaler Inhale 2 puffs into the lungs 2 (two) times daily.     clopidogrel (PLAVIX) 75 MG tablet Take 75 mg by mouth daily.     DULoxetine (CYMBALTA) 60 MG capsule Take 60 mg by mouth daily.     famotidine (PEPCID) 10 MG tablet Take 20 mg by mouth at bedtime.  3   finasteride (PROPECIA) 1 MG tablet Take 5 mg by mouth daily.     Fluticasone-Salmeterol (WIXELA INHUB IN) Inhale 1 puff into the lungs daily.     loratadine  (CLARITIN) 10 MG tablet Take 10 mg by mouth daily as needed for allergies.     metoprolol (TOPROL-XL) 200 MG 24 hr tablet Take 100 mg by mouth daily.     nitroGLYCERIN (NITROSTAT) 0.4 MG SL tablet Place 0.4 mg under the tongue every 5 (five) minutes as needed for chest pain.     Omega-3 Fatty Acids (FISH OIL) 1000 MG CAPS Take 1 tablet by mouth 2 (two) times daily.     pantoprazole (PROTONIX) 40 MG tablet Take 40 mg by mouth daily.     ranolazine  (RANEXA ) 500 MG 12 hr tablet Take 1 tablet (500 mg total) by mouth 2 (two) times daily. 180 tablet 3   rosuvastatin  (CRESTOR ) 20 MG tablet Take 1 tablet (20 mg total) by mouth daily. 90 tablet 3   sacubitril-valsartan (ENTRESTO ) 24-26 MG Take 1 tablet by mouth 2 (two) times daily. 180 tablet 3   Simethicone (GAS-X PO) Take 1 tablet by mouth at bedtime.     tamsulosin (FLOMAX) 0.4 MG CAPS capsule Take 0.4 mg by mouth daily after breakfast.     traZODone (DESYREL) 100 MG tablet Take 100 mg by mouth at bedtime.     No current facility-administered medications for this visit.    REVIEW OF SYSTEMS  All other systems reviewed and are negative  Objective:  Objective   There were no vitals filed for this visit. There is no height or weight on file to calculate BMI.  Physical Exam General: no acute distress Cardiac: hemodynamically stable Pulm: normal work of breathing Abdomen: non-tender, no pulsatile mass*** Neuro: alert, no focal deficit Extremities: no edema, cyanosis or wounds*** Vascular:   Right: ***  Left: ***  Data: CTA independently reviewed Previously placed EVAR, the abdominal aorta appears to be completely shrunk around the stent and there is no apparent endoleak although evaluation is limited due to contrast timing.  There is a left renal stent that appears patent.  The visceral aorta measures 4.3 cm.   There is a descending thoracic aortic aneurysm measuring about 5.5 cm. There is also an ascending aortic aneurysm measuring about  5.5 cm     Assessment/Plan:   Drew [name] is a 83 y.o. male with a AAA and thoracic aortic aneurysm.  He underwent EVAR many years ago and the AAA appears well sealed.  The thoracic aortic aneurysm is about 5.5 cm although the entire aorta is mildly dilated and the visceral aorta is about 4.3 cm.  We again  discussed the threshold of 6 cm for thoracic aortic aneurysm and we will hold off on any descending thoracic aortic repair until he is above this threshold due to his increased risk of spinal cord ischemia given his previous EVAR.    We will plan follow-up in 6 months with repeat CTA. He follows up with CT surgery in 2 weeks regarding his ascending aneurysm  Recommendations to optimize cardiovascular risk: Abstinence from all tobacco products. Blood glucose control with goal A1c < 7%. Blood pressure control with goal blood pressure < 140/90 mmHg. Lipid reduction therapy with goal LDL-C <100 mg/dL  Aspirin 81mg  PO QD.  Atorvastatin 40-80mg  PO QD (or other "high intensity" statin therapy).   Philipp Brawn MD [facility]

## 2024-03-12 ENCOUNTER — Ambulatory Visit: Payer: PPO | Admitting: Vascular Surgery

## 2024-03-12 ENCOUNTER — Encounter: Payer: Self-pay | Admitting: Vascular Surgery

## 2024-03-12 ENCOUNTER — Ambulatory Visit: Attending: Vascular Surgery | Admitting: Vascular Surgery

## 2024-03-12 VITALS — BP 117/74 | HR 78 | Temp 98.0°F | Resp 18 | Ht 64.0 in | Wt 143.4 lb

## 2024-03-12 DIAGNOSIS — I7123 Aneurysm of the descending thoracic aorta, without rupture: Secondary | ICD-10-CM | POA: Diagnosis not present

## 2024-03-12 DIAGNOSIS — I6522 Occlusion and stenosis of left carotid artery: Secondary | ICD-10-CM | POA: Diagnosis not present

## 2024-03-15 ENCOUNTER — Other Ambulatory Visit: Payer: Self-pay | Admitting: *Deleted

## 2024-03-15 DIAGNOSIS — I6522 Occlusion and stenosis of left carotid artery: Secondary | ICD-10-CM

## 2024-03-19 ENCOUNTER — Ambulatory Visit: Admitting: Vascular Surgery

## 2024-03-30 ENCOUNTER — Ambulatory Visit: Admitting: Thoracic Surgery (Cardiothoracic Vascular Surgery)

## 2024-09-23 NOTE — Progress Notes (Unsigned)
 Patient ID: LASALLE ABEE, male   DOB: [birth year], 83 y.o.   MRN: [id number]  Reason for Consult: No chief complaint on file.   Referred by Conley Dene BROCKS., MD  Subjective:     HPI DANNELL GORTNEY is a 83 y.o. male presenting for follow-up.  He has a history of a AAA that was repaired with EVAR over 20 years ago in [place].  He has a known 5.5 cm descending thoracic aortic aneurysm and today is a 68-month follow-up.  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   Coronary artery disease 10/15/2016   Diabetes type 2, uncontrolled 04/22/2018   Dyspnea 03/01/2015   Followed in Pulmonary clinic/ [facility]/ [facility] - Labs 12/29/14 nl cbc, tsh, bmet  -  03/01/2015  Walked RA x 3 laps @ 185 ft each stopped due to  End of study, no sob or desat - pfts 03/01/2015  Restrictive only with ERV 36% >> rec try off spiriva     Esophageal reflux    History of lung cancer 09/02/2017   History of lung cancer 09/02/2017   Hydrocele    Hypercholesteremia 10/15/2016   Hyperlipidemia    Hypertension    Insomnia 01/11/2021   Ischemic heart disease    Low back pain 01/11/2021   Neck pain 01/11/2021   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   Pain in right hand 03/23/2020   Peripheral vascular disease    Renal artery stenosis 10/15/2016   Sleep disorder 04/22/2018   Thoracic aortic aneurysm without mention of  rupture 2011   Type 2 diabetes mellitus (HCC) 01/11/2021   Family History  Problem Relation Age of Onset   Heart disease Mother    Brain cancer Mother    Heart disease Father    Lung cancer Father        smoked   CAD Father    Prostate cancer Father    Thyroid  disease Sister    Past Surgical History:  Procedure Laterality Date   ABDOMINAL AORTIC ANEURYSM REPAIR W/ ENDOLUMINAL GRAFT  1999   CORONARY ARTERY BYPASS GRAFT     CORONARY STENT PLACEMENT  2003   left lower lobectomy  2012    Short Social History:  Social History   Tobacco Use   Smoking status: Former    Current packs/day: 0.00    Average packs/day: 2.0 packs/day for 36.0 years (72.0 ttl pk-yrs)    Types: Cigarettes    Start date: 10/14/1952    Quit date: 10/14/1988    Years since quitting: 35.9   Smokeless tobacco: Former    Quit date: 04/09/1988  Substance Use Topics   Alcohol use: No    Alcohol/week: 0.0 standard drinks of alcohol    Allergies[1]  Current Outpatient Medications  Medication Sig Dispense Refill   Acetaminophen (ACETAMINOPHEN EXTRA STRENGTH) 500 MG capsule Take 1 capsule by mouth at  bedtime.     aspirin 81 MG tablet Take 81 mg by mouth daily.     budesonide-formoterol (SYMBICORT) 80-4.5 MCG/ACT inhaler Inhale 2 puffs into the lungs 2 (two) times daily.     clopidogrel (PLAVIX) 75 MG tablet Take 75 mg by mouth daily.     DULoxetine (CYMBALTA) 60 MG capsule Take 60 mg by mouth daily.     famotidine (PEPCID) 10 MG tablet Take 20 mg by mouth at bedtime.  3   finasteride (PROPECIA) 1 MG tablet Take 5 mg by mouth daily.     Fluticasone-Salmeterol (WIXELA INHUB IN) Inhale 1 puff into the lungs daily.     loratadine (CLARITIN) 10 MG tablet Take 10 mg by mouth daily as needed for allergies.     metoprolol (TOPROL-XL) 200 MG 24 hr tablet Take 100 mg by mouth daily.     nitroGLYCERIN (NITROSTAT) 0.4 MG SL tablet Place 0.4 mg under the tongue every 5 (five) minutes as needed for chest pain.     Omega-3  Fatty Acids (FISH OIL) 1000 MG CAPS Take 1 tablet by mouth 2 (two) times daily.     pantoprazole (PROTONIX) 40 MG tablet Take 40 mg by mouth daily.     ranolazine  (RANEXA ) 500 MG 12 hr tablet Take 1 tablet (500 mg total) by mouth 2 (two) times daily. 180 tablet 3   rosuvastatin  (CRESTOR ) 20 MG tablet Take 1 tablet (20 mg total) by mouth daily. 90 tablet 3   sacubitril-valsartan (ENTRESTO ) 24-26 MG Take 1 tablet by mouth 2 (two) times daily. 180 tablet 3   Simethicone (GAS-X PO) Take 1 tablet by mouth at bedtime.     tamsulosin (FLOMAX) 0.4 MG CAPS capsule Take 0.4 mg by mouth daily after breakfast.     traZODone (DESYREL) 100 MG tablet Take 100 mg by mouth at bedtime.     No current facility-administered medications for this visit.    REVIEW OF SYSTEMS  All other systems were reviewed and are negative     Objective:  Objective   There were no vitals filed for this visit. There is no height or weight on file to calculate BMI.  Physical Exam General: no acute distress Cardiac: hemodynamically stable Abdomen: non-tender, no pulsatile mass*** Extremities: no edema, cyanosis or wounds*** Vascular:   Right: ***  Left: ***  Data: ***     Assessment/Plan:   DAEMIEN FRONCZAK is a 83 y.o. male with ***  Recommendations to optimize cardiovascular risk: Abstinence from all tobacco products. Blood glucose control with goal A1c < 7%. Blood pressure control with goal blood pressure < 140/90 mmHg. Lipid reduction therapy with goal LDL-C <55 mg/dL  Aspirin 81mg  PO QD.  Atorvastatin 40-80mg  PO QD (or other high intensity statin therapy).   Norman GORMAN Serve MD [facility]     [1]  Allergies Allergen Reactions   Ethanol Hives    ETHANOL, AFTER SHAVE....FOR LONG PERIODS   Alcohol Hives   Azithromycin    Lisinopril    Tramadol Hcl    Cephalexin Itching and Rash   Neomycin Rash    MAKES SORE WORSE

## 2024-09-24 ENCOUNTER — Ambulatory Visit (HOSPITAL_COMMUNITY)
Admission: RE | Admit: 2024-09-24 | Discharge: 2024-09-24 | Disposition: A | Source: Ambulatory Visit | Attending: Surgery | Admitting: Surgery

## 2024-09-24 ENCOUNTER — Ambulatory Visit: Admitting: Vascular Surgery

## 2024-09-24 ENCOUNTER — Encounter: Payer: Self-pay | Admitting: Vascular Surgery

## 2024-09-24 VITALS — BP 144/85 | HR 56 | Temp 97.8°F | Ht 64.0 in | Wt 141.0 lb

## 2024-09-24 DIAGNOSIS — I6521 Occlusion and stenosis of right carotid artery: Secondary | ICD-10-CM | POA: Diagnosis not present

## 2024-09-24 DIAGNOSIS — I6522 Occlusion and stenosis of left carotid artery: Secondary | ICD-10-CM

## 2024-10-08 DIAGNOSIS — I493 Ventricular premature depolarization: Secondary | ICD-10-CM | POA: Diagnosis not present

## 2024-10-08 DIAGNOSIS — I10 Essential (primary) hypertension: Secondary | ICD-10-CM | POA: Diagnosis not present

## 2024-10-08 DIAGNOSIS — E785 Hyperlipidemia, unspecified: Secondary | ICD-10-CM | POA: Diagnosis not present

## 2024-10-08 DIAGNOSIS — I255 Ischemic cardiomyopathy: Secondary | ICD-10-CM | POA: Diagnosis not present

## 2024-10-08 DIAGNOSIS — I2510 Atherosclerotic heart disease of native coronary artery without angina pectoris: Secondary | ICD-10-CM | POA: Diagnosis not present

## 2024-10-08 DIAGNOSIS — R9431 Abnormal electrocardiogram [ECG] [EKG]: Secondary | ICD-10-CM | POA: Diagnosis not present

## 2024-10-09 DIAGNOSIS — I2510 Atherosclerotic heart disease of native coronary artery without angina pectoris: Secondary | ICD-10-CM | POA: Diagnosis not present

## 2024-10-10 DIAGNOSIS — I493 Ventricular premature depolarization: Secondary | ICD-10-CM | POA: Diagnosis not present

## 2024-10-10 DIAGNOSIS — I255 Ischemic cardiomyopathy: Secondary | ICD-10-CM | POA: Diagnosis not present

## 2024-10-10 DIAGNOSIS — I10 Essential (primary) hypertension: Secondary | ICD-10-CM | POA: Diagnosis not present

## 2024-10-10 DIAGNOSIS — I2510 Atherosclerotic heart disease of native coronary artery without angina pectoris: Secondary | ICD-10-CM | POA: Diagnosis not present

## 2024-10-10 DIAGNOSIS — E785 Hyperlipidemia, unspecified: Secondary | ICD-10-CM | POA: Diagnosis not present

## 2024-10-10 DIAGNOSIS — R9431 Abnormal electrocardiogram [ECG] [EKG]: Secondary | ICD-10-CM | POA: Diagnosis not present

## 2024-10-11 DIAGNOSIS — I480 Paroxysmal atrial fibrillation: Secondary | ICD-10-CM | POA: Diagnosis not present

## 2024-10-11 DIAGNOSIS — I2510 Atherosclerotic heart disease of native coronary artery without angina pectoris: Secondary | ICD-10-CM | POA: Diagnosis not present

## 2024-10-11 DIAGNOSIS — I255 Ischemic cardiomyopathy: Secondary | ICD-10-CM | POA: Diagnosis not present

## 2024-10-11 DIAGNOSIS — I517 Cardiomegaly: Secondary | ICD-10-CM | POA: Diagnosis not present

## 2024-10-11 DIAGNOSIS — R9431 Abnormal electrocardiogram [ECG] [EKG]: Secondary | ICD-10-CM | POA: Diagnosis not present

## 2024-10-12 DIAGNOSIS — I480 Paroxysmal atrial fibrillation: Secondary | ICD-10-CM | POA: Diagnosis not present

## 2024-10-12 DIAGNOSIS — I2510 Atherosclerotic heart disease of native coronary artery without angina pectoris: Secondary | ICD-10-CM | POA: Diagnosis not present

## 2024-10-12 DIAGNOSIS — I255 Ischemic cardiomyopathy: Secondary | ICD-10-CM | POA: Diagnosis not present

## 2024-10-20 ENCOUNTER — Other Ambulatory Visit: Payer: Self-pay

## 2024-10-20 DIAGNOSIS — I7143 Infrarenal abdominal aortic aneurysm, without rupture: Secondary | ICD-10-CM

## 2024-10-20 DIAGNOSIS — I714 Abdominal aortic aneurysm, without rupture, unspecified: Secondary | ICD-10-CM | POA: Insufficient documentation

## 2024-10-21 ENCOUNTER — Encounter: Payer: Self-pay | Admitting: Cardiology

## 2024-10-22 ENCOUNTER — Encounter: Payer: Self-pay | Admitting: Cardiology

## 2024-10-28 ENCOUNTER — Telehealth: Payer: Self-pay

## 2024-10-28 NOTE — Telephone Encounter (Signed)
 spoke with pt's daughter who is requesting that the appt be canceled and r/s at a later date. Daughter does not know when pt will be out of rehab and will call to r/s CT and f/u with [facility].

## 2024-11-18 ENCOUNTER — Ambulatory Visit: Admitting: Cardiology

## 2024-11-26 ENCOUNTER — Ambulatory Visit: Admitting: Vascular Surgery

## 2024-12-30 ENCOUNTER — Other Ambulatory Visit (HOSPITAL_BASED_OUTPATIENT_CLINIC_OR_DEPARTMENT_OTHER): Admitting: Radiology

## 2025-01-07 ENCOUNTER — Ambulatory Visit: Admitting: Vascular Surgery

## 2025-01-14 ENCOUNTER — Ambulatory Visit: Admitting: Cardiology
